# Patient Record
Sex: Female | Born: 1980 | Race: Black or African American | Hispanic: No | Smoking: Former smoker
Health system: Southern US, Community
[De-identification: ages and names within clinical notes are randomized; demographics above are authoritative.]

## PROBLEM LIST (undated history)

## (undated) DIAGNOSIS — D649 Anemia, unspecified: Secondary | ICD-10-CM

## (undated) DIAGNOSIS — F32A Depression, unspecified: Secondary | ICD-10-CM

## (undated) HISTORY — DX: Anemia, unspecified: D64.9

## (undated) HISTORY — DX: Depression, unspecified: F32.A

## (undated) HISTORY — PX: DILATION AND CURETTAGE OF UTERUS: SHX78

---

## 2004-06-14 ENCOUNTER — Emergency Department (HOSPITAL_COMMUNITY): Admission: EM | Admit: 2004-06-14 | Discharge: 2004-06-14 | Payer: Self-pay | Admitting: Family Medicine

## 2005-11-12 ENCOUNTER — Inpatient Hospital Stay (HOSPITAL_COMMUNITY): Admission: AD | Admit: 2005-11-12 | Discharge: 2005-11-12 | Payer: Self-pay | Admitting: Family Medicine

## 2005-11-12 ENCOUNTER — Encounter: Payer: Self-pay | Admitting: Emergency Medicine

## 2005-11-15 ENCOUNTER — Encounter (INDEPENDENT_AMBULATORY_CARE_PROVIDER_SITE_OTHER): Payer: Self-pay | Admitting: *Deleted

## 2005-11-15 ENCOUNTER — Ambulatory Visit (HOSPITAL_COMMUNITY): Admission: AD | Admit: 2005-11-15 | Discharge: 2005-11-15 | Payer: Self-pay | Admitting: Obstetrics & Gynecology

## 2006-07-19 ENCOUNTER — Emergency Department (HOSPITAL_COMMUNITY): Admission: EM | Admit: 2006-07-19 | Discharge: 2006-07-19 | Payer: Self-pay | Admitting: Family Medicine

## 2006-11-01 ENCOUNTER — Emergency Department (HOSPITAL_COMMUNITY): Admission: EM | Admit: 2006-11-01 | Discharge: 2006-11-01 | Payer: Self-pay | Admitting: Emergency Medicine

## 2007-10-21 IMAGING — US US PELVIS COMPLETE MODIFY
1 series · 14 of 25 positions shown · non-contrast
Comparison: none

CLINICAL DATA: Right lower quadrant pain.  
TRANSABDOMINAL AND TRANSVAGINAL PELVIC ULTRASOUND:
TECHNIQUE: Both transabdominal and transvaginal ultrasound examinations of the pelvis were performed including evaluation of the uterus, ovaries, adnexal regions, and pelvic cul-de-sac.

[Series 1: unknown · 0.28mm/px · 14 of 51 slices shown]
[im 1/51]
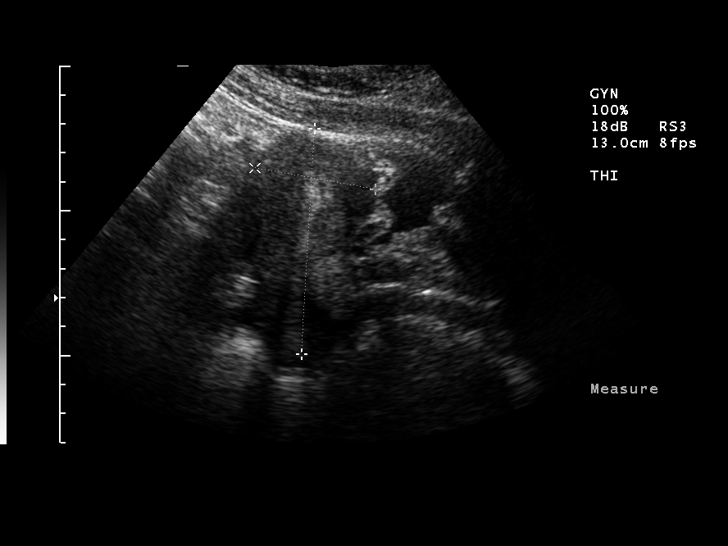
[im 5/51]
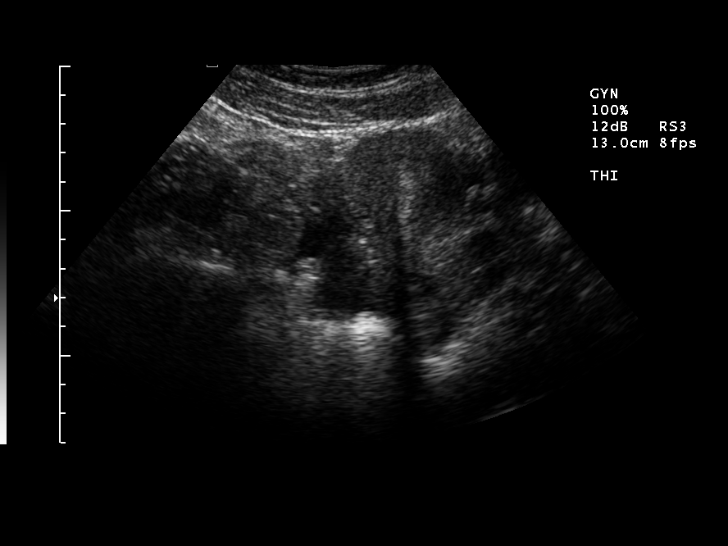
[im 9/51]
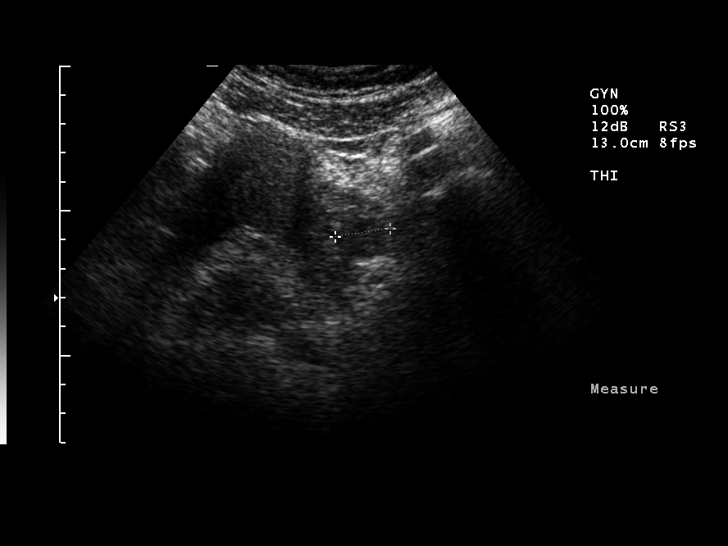
[im 13/51]
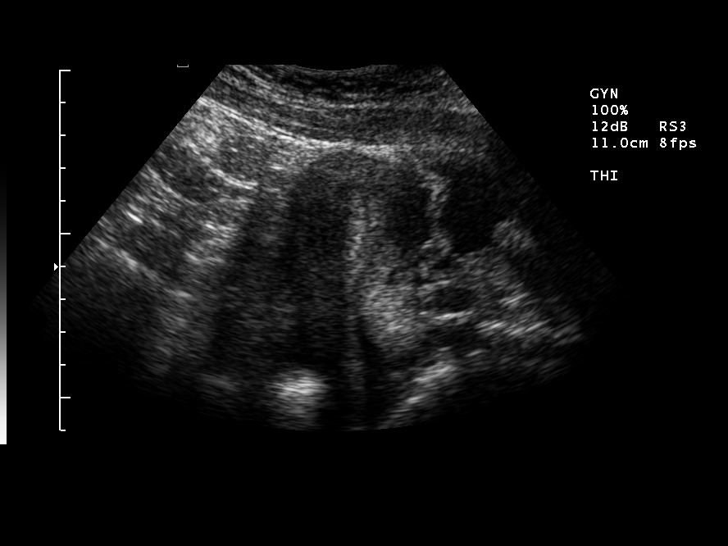
[im 17/51]
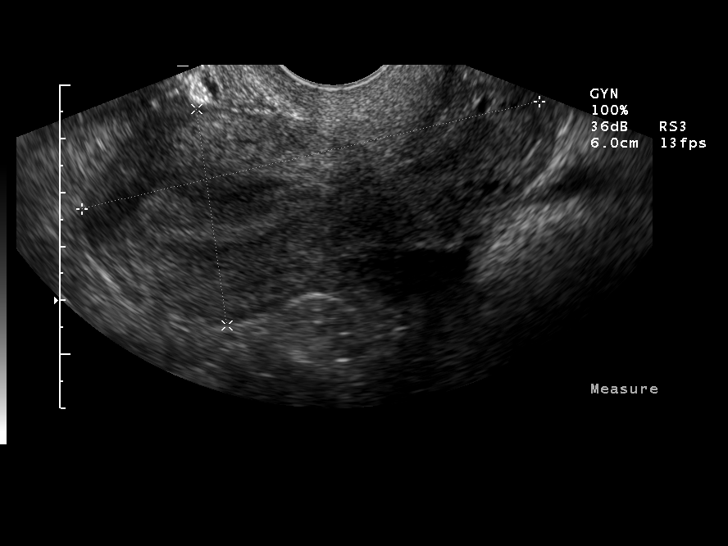
[im 19/51]
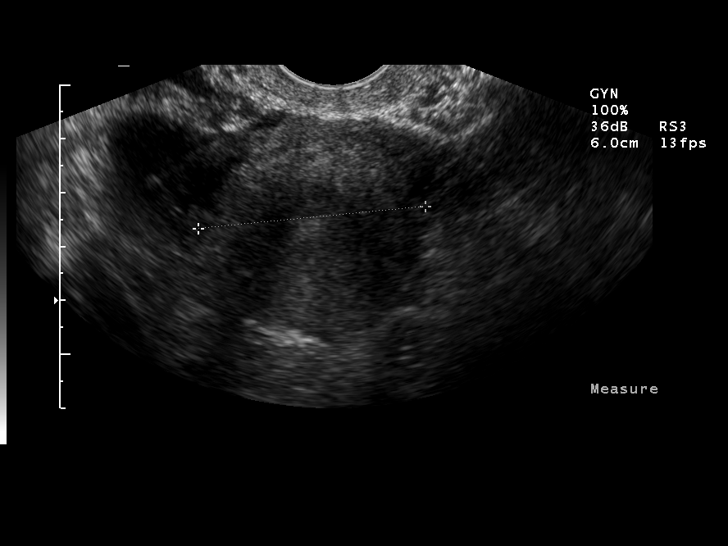
[im 23/51]
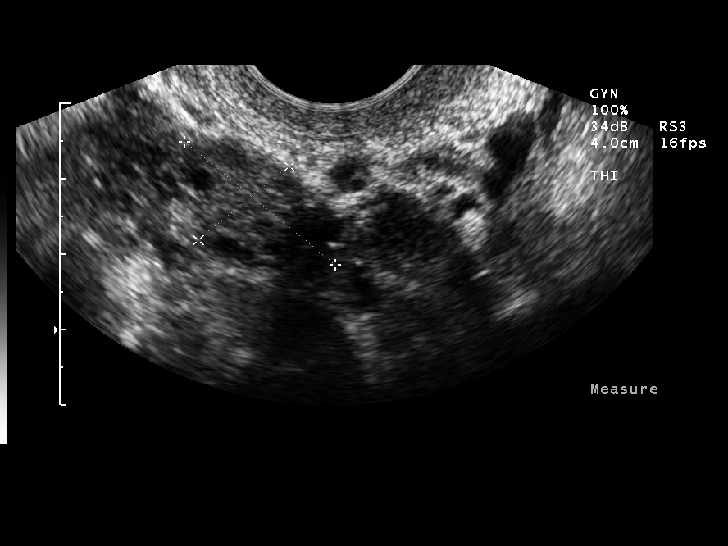
[im 28/51]
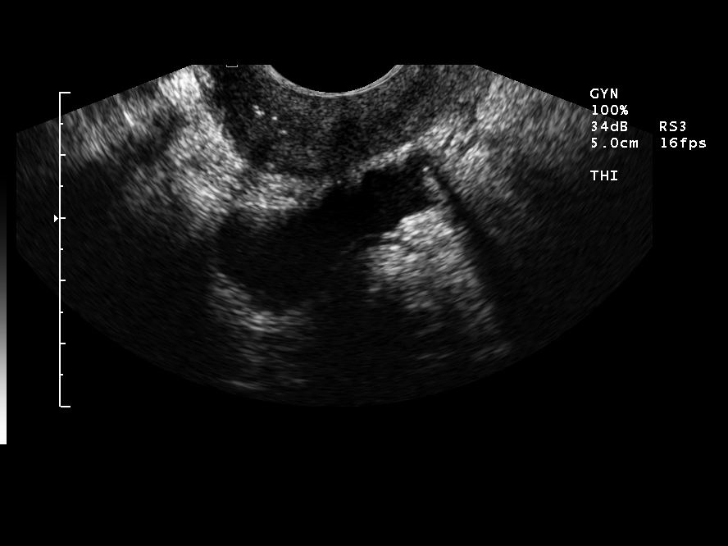
[im 32/51]
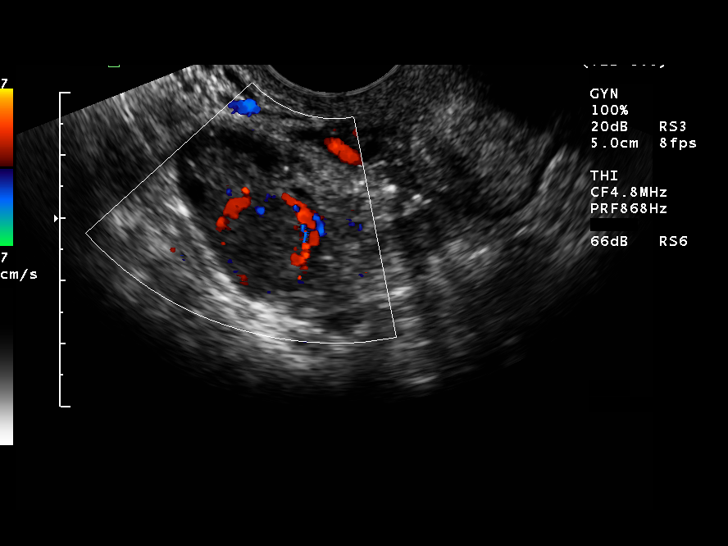
[im 34/51]
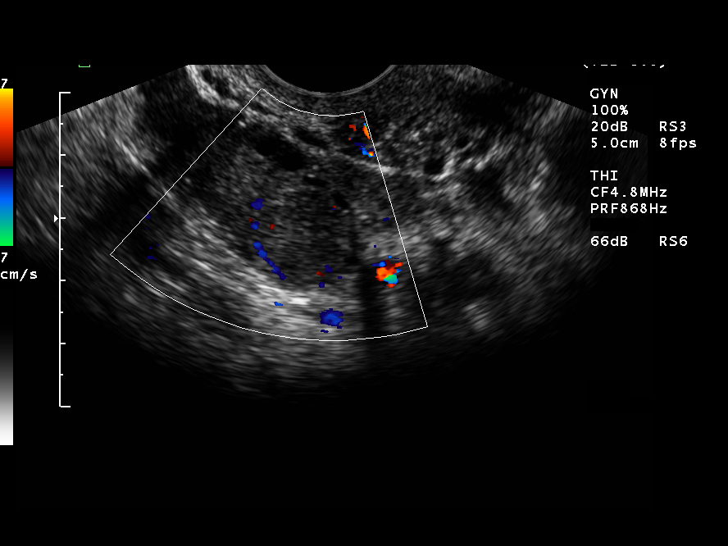
[im 38/51]
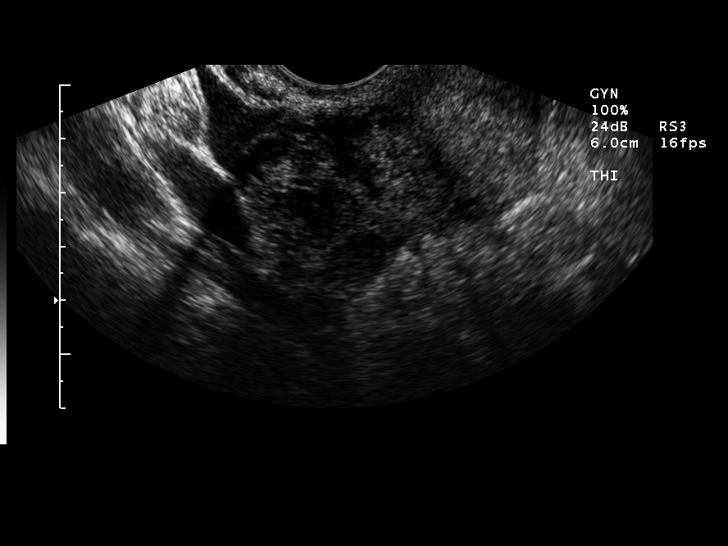
[im 42/51]
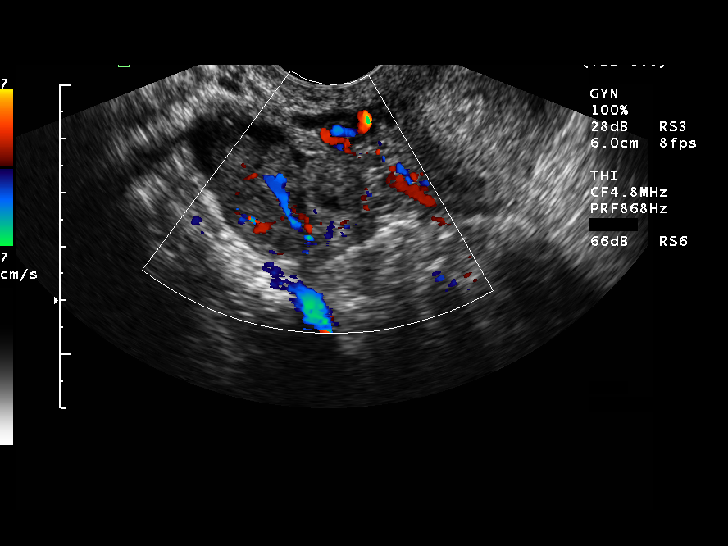
[im 46/51]
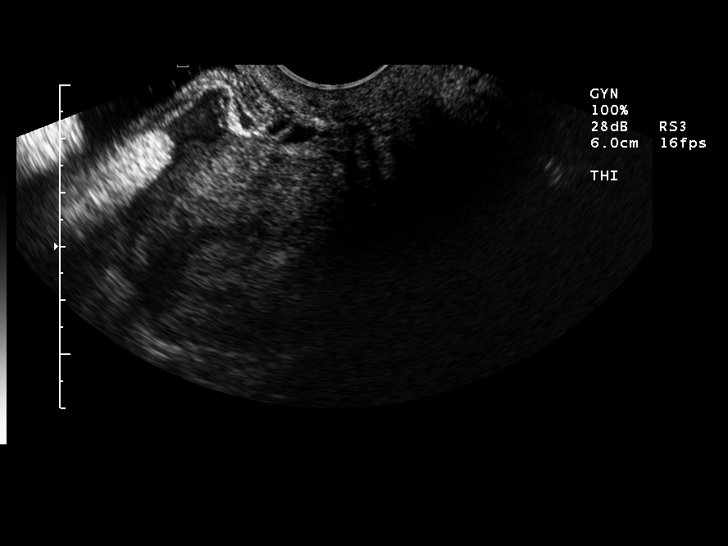
[im 51/51]
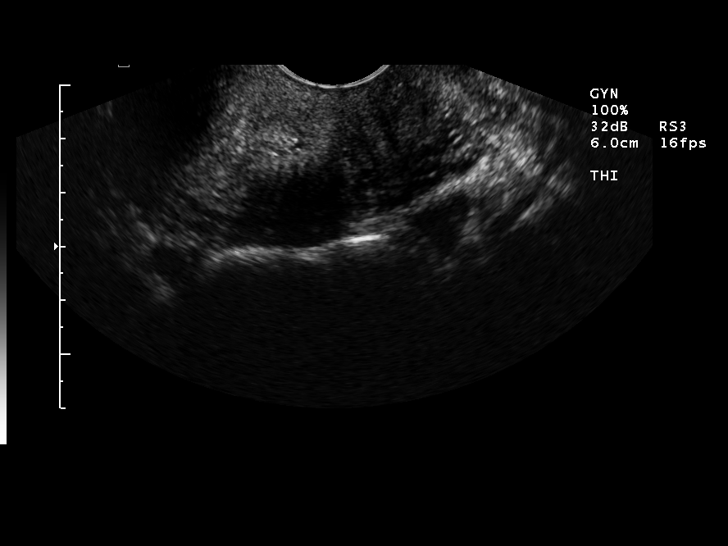

[14 of 25 positions shown; findings below may reference images not displayed]

FINDINGS: The uterus measures 8.7 cm in length and fundus measures 4.1 cm x 4.2 cm in transverse dimensions.  Endometrial stripe complex measures approximately 1 cm in thickness.  Right ovary measures 2.7 x 3.1 x 4.5 cm and contains a 1.6 x 1.6 x 1.8 cm hypoechoic focus, the etiology of which is uncertain.  This may represent a complex cyst or lesion such as ovarian fibroma.  The possibility of right ectopic pregnancy cannot be excluded with certainty.  Left ovary measures 1.6 x 2.2 x 2.6 cm.  Moderate amount of free pelvic fluid.  No evidence of intrauterine or extrauterine gestational sac.
IMPRESSION: Slightly prominent-sized right ovary which contains a hypoechoic focus, the etiology of which is uncertain.  Consider follow-up ultrasound after a couple of menstrual cycles to re-evaluate the right ovary.  Moderate free fluid.  Ectopic pregnancy cannot be excluded.

## 2008-08-04 ENCOUNTER — Emergency Department (HOSPITAL_COMMUNITY): Admission: EM | Admit: 2008-08-04 | Discharge: 2008-08-04 | Payer: Self-pay | Admitting: Emergency Medicine

## 2010-07-08 LAB — POCT PREGNANCY, URINE: Preg Test, Ur: NEGATIVE

## 2010-08-15 NOTE — Op Note (Signed)
NAME:  Debra Walsh, Debra Walsh               ACCOUNT NO.:  [id number]   MEDICAL RECORD NO.:  [id number]         PATIENT TYPE:  AMB   LOCATION:                                FACILITY:  [facility]   PHYSICIAN:  Gretta Cool, M.D.      DATE OF BIRTH:   DATE OF PROCEDURE:  DATE OF DISCHARGE:                                 OPERATIVE REPORT   PREOPERATIVE DIAGNOSIS:  Right ectopic pregnancy, ruptured, with  hemoperitoneum.   POSTOPERATIVE DIAGNOSIS:  Right ectopic pregnancy, ruptured, with  hemoperitoneum.   PROCEDURE:  Diagnostic laparoscopy, right salpingo-oophorectomy, lysis of  extensive ovarian and tubal adhesions.   SURGEON:  Gretta Cool, M.D.   ANESTHESIA:  General orotracheal.   INDICATIONS FOR PROCEDURE:  30 year old G2, P1, last menstrual period October 10, 2005, no aberration of menstrual periods prior to that.  She has a  history of gonorrhea and chlamydia after her first child at age 82.  She has  been infertile since that time.  She presented to the emergency room three  days earlier to Dr. Leda Quail and had a 300+ HCG titer and some fluid  and a mass in the right lower quadrant compatible with clot.  She had no  uterine pregnancy identified.  She was elected to follow up with ectopic  precaution.  The patient returned today after taking ibuprofen this  afternoon with sudden onset of severe pain.  Her pain was increased with  moving, walking, activity.  She had repeat ultrasound that showed larger  hemoperitoneum and an enlarged mass in the right lower quadrant.  She is now  admitted for definitive therapy by diagnostic laparoscopy, possible  salpingectomy, possible laparotomy.   DESCRIPTION OF PROCEDURE:  Under excellent general anesthesia, orotracheal,  with the patient prepped and draped in Allen stirrups, a subumbilical  incision was made and Veress cannula introduced.  After adequate  pneumoperitoneum, the laparoscope and trocar was introduced and pelvic  organs  visualized.  There was approximately 50 to 100 cc of blood in the  peritoneal cavity.  There was a large clot around the right fallopian tube.  The fallopian tube was bound down into the cul-de-sac and draped over the  ovary with extensive adhesions there.  As the adhesions were lysed, the  ovary and tube were progressively mobilized until the clot could be  evacuated.  With suction irrigation, the clot and blood and debris was  removed.  There was no clear rupture site.  There was some bleeding from  adhesions.  Because of the extensive nature of adhesions, decision was made  to proceed with a right salpingo-oophorectomy.  The fallopian tube was  mobilized sufficiently to allow cautery with the Gyrus tripolar instrument.  The tube was progressively resected.  At this point, the pedicles were all  dry.  The fallopian tube was removed through the 10/11-mm port site without  difficulty.  The pelvis was then irrigated once again to remove all  clot and debris.  At this point, the pedicles remained dry.  The incisions  were closed with deep suture of 0  Vicryl and skin closure of 5-0 Vicryl and  Steri-Strips.  At the end of the procedure, sponge and lap counts were  correct, and there were no complications.           ______________________________  Gretta Cool, M.D.     CWL/MEDQ  D:  11/15/2005  T:  11/15/2005  Job:  [id number]   cc:   M. Leda Quail, MD

## 2010-12-11 ENCOUNTER — Inpatient Hospital Stay (HOSPITAL_COMMUNITY)
Admission: RE | Admit: 2010-12-11 | Discharge: 2010-12-11 | Disposition: A | Discharge status: Home / Self Care | Payer: Self-pay | Source: Ambulatory Visit | Attending: Family Medicine | Admitting: Family Medicine

## 2011-12-01 ENCOUNTER — Encounter (HOSPITAL_COMMUNITY): Payer: Self-pay | Admitting: Emergency Medicine

## 2011-12-01 ENCOUNTER — Emergency Department (HOSPITAL_COMMUNITY): Payer: Self-pay

## 2011-12-01 ENCOUNTER — Emergency Department (HOSPITAL_COMMUNITY)
Admission: EM | Admit: 2011-12-01 | Discharge: 2011-12-01 | Disposition: A | Discharge status: Home / Self Care | Payer: Self-pay | Attending: Emergency Medicine | Admitting: Emergency Medicine

## 2011-12-01 DIAGNOSIS — M25561 Pain in right knee: Secondary | ICD-10-CM

## 2011-12-01 DIAGNOSIS — M25569 Pain in unspecified knee: Secondary | ICD-10-CM | POA: Insufficient documentation

## 2011-12-01 MED ORDER — HYDROCODONE-ACETAMINOPHEN 5-325 MG PO TABS: ORAL_TABLET | ORAL | Status: AC

## 2011-12-01 MED ORDER — NAPROXEN 250 MG PO TABS: 250.0000 mg | ORAL_TABLET | Freq: Two times a day (BID) | ORAL | Status: AC

## 2011-12-01 MED ORDER — IBUPROFEN 400 MG PO TABS
400.0000 mg | ORAL_TABLET | Freq: Once | ORAL | Status: AC
  Administered 2011-12-01: 400 mg via ORAL
  Filled 2011-12-01: qty 1

## 2011-12-01 NOTE — ED Provider Notes (Signed)
History    This chart was scribed for Laray Anger, DO, MD by Smitty Pluck. The patient was seen in room TR09C and the patient's care was started at 1:30PM.   CSN: [id number]  Arrival date & time 12/01/11  1140   First MD Initiated Contact with Patient 12/01/11 1330      Chief Complaint  Patient presents with  . Knee Pain    Patient is a 31 y.o. female presenting with knee pain. The history is provided by the patient.  Knee Pain   Debra Walsh is a 31 y.o. female who presents to the Emergency Department complaining of gradual onset and persistence of constant right knee "pain" onset 1 day ago. Pt states she works as a Engineer, mining recall specific injury. Reports that bending and movement aggravates the pain. Denies any other pain. Denies tingling/numbness in extremity, no focal motor weakness, no rash, no fevers.    History reviewed. No pertinent past medical history.  History reviewed. No pertinent past surgical history.   History  Substance Use Topics  . Smoking status: Current Everyday Smoker  . Smokeless tobacco: Not on file  . Alcohol Use: Yes     occ    Review of Systems ROS: Statement: All systems negative except as marked or noted in the HPI; Constitutional: Negative for fever and chills. ; ; Eyes: Negative for eye pain, redness and discharge. ; ; ENMT: Negative for ear pain, hoarseness, nasal congestion, sinus pressure and sore throat. ; ; Cardiovascular: Negative for chest pain, palpitations, diaphoresis, dyspnea and peripheral edema. ; ; Respiratory: Negative for cough, wheezing and stridor. ; ; Gastrointestinal: Negative for nausea, vomiting, diarrhea, abdominal pain, blood in stool, hematemesis, jaundice and rectal bleeding. . ; ; Genitourinary: Negative for dysuria, flank pain and hematuria. ; ; Musculoskeletal: +knee pain. Negative for back pain and neck pain. Negative for swelling and trauma.; ; Skin: Negative for pruritus, rash,  abrasions, blisters, bruising and skin lesion.; ; Neuro: Negative for headache, lightheadedness and neck stiffness. Negative for weakness, altered level of consciousness , altered mental status, extremity weakness, paresthesias, involuntary movement, seizure and syncope.     Allergies  Review of patient's allergies indicates no known allergies.  Home Medications   Current Outpatient Rx  Name Route Sig Dispense Refill  . GOODY HEADACHE PO Oral Take 2 Packages by mouth once. For pain    . ICY HOT EX Apply externally Apply 1 application topically daily as needed. Knee pain      BP 122/95  Pulse 88  Temp 98 F (36.7 C)  Resp 16  SpO2 100%  LMP 11/30/2011  Physical Exam 1335: Physical examination:  Nursing notes reviewed; Vital signs and O2 SAT reviewed;  Constitutional: Well developed, Well nourished, Well hydrated, In no acute distress; Head:  Normocephalic, atraumatic; Eyes: EOMI, PERRL, No scleral icterus; ENMT: Mouth and pharynx normal, Mucous membranes moist; Neck: Supple, Full range of motion, No lymphadenopathy; Cardiovascular: Regular rate and rhythm, No murmur, rub, or gallop; Respiratory: Breath sounds clear & equal bilaterally, No rales, rhonchi, wheezes.  Speaking full sentences with ease, Normal respiratory effort/excursion; Chest: Nontender, Movement normal;; Extremities: Pulses normal, +decreased F/E right knee, due to increasing pain.  Pt is able to lift extended RLE off stretcher, and extend right lower leg against resistance.  No ligamentous laxity.  No patellar or quad tendon step-offs.  NMS intact right foot, strong pedal pp.  No proximal fibular head tenderness.  No edema,  erythema, ecchymosis, warmth or deformity.  +TTP patella area, no other specific area of point tenderness. +plantarflexion of right foot with calf squeeze.  NT right ankle/foot. No calf tenderness, edema or asymmetry.; Neuro: AA&Ox3, Major CN grossly intact.  Speech clear. No gross focal motor or sensory  deficits in extremities.; Skin: Color normal, Warm, Dry.   ED Course  Procedures    MDM  MDM Reviewed: nursing note and vitals Interpretation: x-ray   Dg Knee Complete 4 Views Right 12/01/2011  *RADIOLOGY REPORT*  Clinical Data: Right knee pain.  No injury.  RIGHT KNEE - COMPLETE 4+ VIEW  Comparison: None.  Findings: Anatomic alignment of the right knee.  No effusion.  No fracture.  IMPRESSION: Negative.   Original Report Authenticated By: Andreas Newport, M.D.       1400:  No acute findings on XR.  Will tx symptomatically.  Dx testing d/w pt.  Questions answered.  Verb understanding, agreeable to d/c home with outpt f/u.      I personally performed the services described in this documentation, which was scribed in my presence. The recorded information has been reviewed and considered. Natalea Sutliff Allison Quarry, DO 12/03/11 [phone]

## 2011-12-01 NOTE — ED Notes (Signed)
Pt c/o right knee pain x 3 days; pt denies obvious injury 

## 2011-12-01 NOTE — Progress Notes (Signed)
Orthopedic Tech Progress Note Patient Details:  Debra Walsh [birth year] [id number]  Ortho Devices Type of Ortho Device: Crutches;Knee Immobilizer Ortho Device/Splint Location: (R) LE Ortho Device/Splint Interventions: Application   [name] 12/01/2011, 1:57 PM

## 2013-07-24 ENCOUNTER — Encounter (HOSPITAL_COMMUNITY): Payer: Self-pay | Admitting: Emergency Medicine

## 2013-07-24 ENCOUNTER — Emergency Department (HOSPITAL_COMMUNITY)
Admission: EM | Admit: 2013-07-24 | Discharge: 2013-07-24 | Disposition: A | Discharge status: Home / Self Care | Payer: Self-pay | Attending: Emergency Medicine | Admitting: Emergency Medicine

## 2013-07-24 ENCOUNTER — Emergency Department (HOSPITAL_COMMUNITY): Payer: Self-pay

## 2013-07-24 DIAGNOSIS — J069 Acute upper respiratory infection, unspecified: Secondary | ICD-10-CM | POA: Insufficient documentation

## 2013-07-24 DIAGNOSIS — K029 Dental caries, unspecified: Secondary | ICD-10-CM | POA: Insufficient documentation

## 2013-07-24 DIAGNOSIS — F17200 Nicotine dependence, unspecified, uncomplicated: Secondary | ICD-10-CM | POA: Insufficient documentation

## 2013-07-24 DIAGNOSIS — R6889 Other general symptoms and signs: Secondary | ICD-10-CM | POA: Insufficient documentation

## 2013-07-24 DIAGNOSIS — J3489 Other specified disorders of nose and nasal sinuses: Secondary | ICD-10-CM | POA: Insufficient documentation

## 2013-07-24 DIAGNOSIS — R6883 Chills (without fever): Secondary | ICD-10-CM | POA: Insufficient documentation

## 2013-07-24 DIAGNOSIS — K089 Disorder of teeth and supporting structures, unspecified: Secondary | ICD-10-CM | POA: Insufficient documentation

## 2013-07-24 DIAGNOSIS — K0889 Other specified disorders of teeth and supporting structures: Secondary | ICD-10-CM

## 2013-07-24 MED ORDER — BENZONATATE 100 MG PO CAPS: 100.0000 mg | ORAL_CAPSULE | Freq: Three times a day (TID) | ORAL | Status: DC | PRN

## 2013-07-24 MED ORDER — NAPROXEN 250 MG PO TABS: 250.0000 mg | ORAL_TABLET | Freq: Two times a day (BID) | ORAL | Status: DC

## 2013-07-24 MED ORDER — PENICILLIN V POTASSIUM 250 MG PO TABS: 250.0000 mg | ORAL_TABLET | Freq: Four times a day (QID) | ORAL | Status: DC

## 2013-07-24 NOTE — ED Notes (Signed)
Pt is here with tightness in chest, cough productive with brown sputum, hard to swallow and complains of right upper tooth pain and hurts to swallow.  Pt thinks she may have had fever.  Pt changed into gown.  Pt sounds congested

## 2013-07-24 NOTE — ED Provider Notes (Signed)
CSN: [id number]     Arrival date & time 07/24/13  0907 History   First MD Initiated Contact with Patient 07/24/13 [phone]     Chief Complaint  Patient presents with  . Cough  . Nasal Congestion  . Dental Pain      HPI Pt was seen at 0930. Per pt, c/o gradual onset and persistence of constant right upper tooth "pain" for the past 1 week.  Denies intra-oral edema, no rash, no facial swelling, no dysphagia, no neck pain.  The condition is aggravated by nothing. The condition is relieved by nothing. The patient has no significant history of serious medical conditions.  Per pt, c/o gradual onset and persistence of constant sore throat, runny/stuffy nose, sinus congestion, chills, and cough for the past 4 days.  Denies objective fevers, no rash, no CP/SOB, no N/V/D, no abd pain.    History reviewed. No pertinent past medical history.  History reviewed. No pertinent past surgical history.  History  Substance Use Topics  . Smoking status: Current Every Day Smoker  . Smokeless tobacco: Not on file  . Alcohol Use: Yes     Comment: occ    Review of Systems ROS: Statement: All systems negative except as marked or noted in the HPI; Constitutional: Negative for fever and +chills. ; ; Eyes: Negative for eye pain and discharge. ; ; ENMT: Positive for dental caries, dental hygiene poor and toothache. +rhinorrhea, sinus congestion, nasal congestion. Negative for ear pain, bleeding gums, dental injury, facial deformity, facial swelling, hoarseness, throat swelling and tongue swollen. ; ; Cardiovascular: Negative for chest pain, palpitations, diaphoresis, dyspnea and peripheral edema. ; ; Respiratory: +cough. Negative for wheezing and stridor. ; ; Gastrointestinal: Negative for nausea, vomiting, diarrhea and abdominal pain. ; ; Genitourinary: Negative for dysuria, flank pain and hematuria. ; ; Musculoskeletal: Negative for back pain and neck pain. ; ; Skin: Negative for rash and skin lesion. ; ; Neuro: Negative  for headache, lightheadedness and neck stiffness.      Allergies  Review of patient's allergies indicates no known allergies.  Home Medications   Prior to Admission medications   Medication Sig Start Date End Date Taking? Authorizing Provider  Aspirin-Acetaminophen-Caffeine (GOODY HEADACHE PO) Take 2 Packages by mouth once. For pain    Historical Provider, MD  Menthol, Topical Analgesic, (ICY HOT EX) Apply 1 application topically daily as needed. Knee pain    Historical Provider, MD   BP 124/87  Pulse 94  Temp(Src) 98.3 F (36.8 C) (Oral)  Resp 18  Ht 5\' 3"  (1.6 m)  Wt 170 lb (77.111 kg)  BMI 30.12 kg/m2  SpO2 100%  LMP 06/29/2013 Physical Exam 0935: Physical examination: Vital signs and O2 SAT: Reviewed; Constitutional: Well developed, Well nourished, Well hydrated, In no acute distress; Head and Face: Normocephalic, Atraumatic; Eyes: EOMI, PERRL, No scleral icterus; ENMT: Mouth and pharynx normal, Poor dentition, Widespread dental decay, Left TM normal, Right TM normal, Mucous membranes moist, +upper right 2nd molar with dental decay.  No gingival erythema, edema, fluctuance, or drainage.  No intra-oral edema. No submandibular or sublingual edema. No hoarse voice, no drooling, no stridor. No trismus. +edemetous nasal turbinates bilat with clear rhinorrhea. Mouth and pharynx without lesions. No tonsillar exudates. No pain with manipulation of larynx. ; Neck: Supple, Full range of motion, No lymphadenopathy; Cardiovascular: Regular rate and rhythm, No murmur, rub, or gallop; Respiratory: Breath sounds clear & equal bilaterally, No rales, rhonchi, wheezes, Normal respiratory effort/excursion; Chest: Nontender, Movement normal; Extremities: Pulses  normal, No tenderness, No edema; Neuro: AA&Ox3, Major CN grossly intact.  No gross focal motor or sensory deficits in extremities. Climbs on and off stretcher easily by herself. Gait steady.; Skin: Color normal, No rash, No petechiae, Warm,  Dry   ED Course  Procedures     EKG Interpretation None      MDM  MDM Reviewed: previous chart, nursing note and vitals Interpretation: x-ray    Dg Chest 2 View 07/24/2013   CLINICAL DATA:  Cough and congestion  EXAM: CHEST  2 VIEW  COMPARISON:  None.  FINDINGS: The heart size and mediastinal contours are within normal limits. Both lungs are clear. The visualized skeletal structures are unremarkable.  IMPRESSION: No active cardiopulmonary disease.   Electronically Signed   By: Daryll Brod M.D.   On: 07/24/2013 10:11    1020:  CXR without infiltrate. Will tx symptomatically at this time. Pt encouraged to f/u with dentist or oral surgeon for her dental needs for good continuity of care and definitive treatment.  Verb understanding.    Alfonzo Feller, DO 07/26/13 1624

## 2013-07-24 NOTE — Discharge Instructions (Signed)
°Emergency Department Resource Guide °1) Find a Doctor and Pay Out of Pocket °Although you won't have to find out who is covered by your insurance plan, it is a good idea to ask around and get recommendations. You will then need to call the office and see if the doctor you have chosen will accept you as a new patient and what types of options they offer for patients who are self-pay. Some doctors offer discounts or will set up payment plans for their patients who do not have insurance, but you will need to ask so you aren't surprised when you get to your appointment. ° °2) Contact Your Local Health Department °Not all health departments have doctors that can see patients for sick visits, but many do, so it is worth a call to see if yours does. If you don't know where your local health department is, you can check in your phone book. The CDC also has a tool to help you locate your state's health department, and many state websites also have listings of all of their local health departments. ° °3) Find a Walk-in Clinic °If your illness is not likely to be very severe or complicated, you may want to try a walk in clinic. These are popping up all over the country in pharmacies, drugstores, and shopping centers. They're usually staffed by nurse practitioners or physician assistants that have been trained to treat common illnesses and complaints. They're usually fairly quick and inexpensive. However, if you have serious medical issues or chronic medical problems, these are probably not your best option. ° °No Primary Care Doctor: °- Call Health Connect at  832-8000 - they can help you locate a primary care doctor that  accepts your insurance, provides certain services, etc. °- Physician Referral Service- 1-800-533-3463 ° °Chronic Pain Problems: °Organization         Address  Phone   Notes  °[facility]  (336) 297-2271 Patients need to be referred by their primary care doctor.  ° °Medication  Assistance: °Organization         Address  Phone   Notes  °[place] [facility] [address] °[place], [place] [address] (336) 641-8030 --Must be a resident of [place] °-- Must have NO insurance coverage whatsoever (no Medicaid/ Medicare, etc.) °-- The pt. MUST have a primary care doctor that directs their care regularly and follows them in the community °  °[facility]  (866) 331-1348   °[employer]  (888) 892-1162   ° °Agencies that provide inexpensive medical care: °Organization         Address  Phone   Notes  °[facility]  (336) 832-8035   °[facility]    (336) 832-7272   °[facility] [address] °[place], [place] [address] (336) 832-4777   °[facility] [address], °[place] (336) 271-4999   °[facility]    (336) 373-0678   °[facility]    (336) 272-1050   °[facility] ° [address], [place] Phone:  (336) 832-4444, Fax:  (336) 832-4440 Hours of Operation:  9 am - 6 pm, M-F.  Also accepts Medicaid/Medicare and self-pay.  °[facility] ° [address], [place] Phone: (336) 832-3150, Fax: (336) [phone]. Hours of Operation:  8:30 am - 5:30 pm, M-F.  Also accepts Medicaid and self-pay.  °[facility] [place] [address], [place] Phone: (336) 878-6027   °[facility] [address], [place], [place] (336)723-1848, Ext. 123 Mondays & Thursdays: 7-9 AM.  First 15 patients are seen on a first come, first serve basis. °  ° °Medicaid-accepting [place] Providers: ° °Organization         Address  Phone   Notes  °[facility] [address], [place] (336) 641-2100 Also accepts self-pay patients.  °[facility] [address], [place] ° (336) 856-9996   °[facility] [address], [place]  (336) 288-8857   °[facility] [address] [place] [address], [place] (336) 299-7000   °Veita Bland [address], [place]  ° (336) 373-1557 Only accepts [place] Access Medicaid patients after they have their name applied to their card.  ° °Self-Pay (no insurance) in [place]: ° °Organization         Address  Phone   Notes  °Sickle Cell Patients, [facility] [address], [place] (336) 832-1970   °[facility] [address], [place] (336) 832-4400   °[facility] ° [address], [place] (336) 992-4800   °[facility]/Dr. Osei-Bonsu ° [address] [place] [address], [place] or [address], [place] (336) 841-8500 Phone number for both [place] and [place] locations is the same.  °[facility] [address], [place] (336) 299-0000   °[facility] [address] [place] [address], [place] or [address] (336) 852-7530 °(336) 878-2260   °[facility] [address], [place] (336) 350-1642, phone; (336) 294-5005, fax Sees patients 1st and 3rd Saturday of every month.  Must not qualify for public or private insurance (i.e. Medicaid, Medicare, [place] Health Choice, Veterans' Benefits) • Household income should be no more than 200% of the poverty level •The clinic cannot treat you if you are pregnant or think you are pregnant • Sexually transmitted diseases are not treated at the clinic.  ° ° °Dental Care: °Organization         Address  Phone  Notes  °[place] [facility] [address], [place] (336) 641-6152 Accepts children up to age 21 who are enrolled in Medicaid or [place] Health Choice; pregnant women with a Medicaid card; and children who have applied for Medicaid or [place] Health Choice, but were declined, whose parents can pay a reduced fee at time of service.  °[place]  [facility] [place]  [address], [place] (336) 641-7733 Accepts children up to age 21 who are enrolled in Medicaid or [place] Health Choice; pregnant women with a Medicaid card; and children who have applied for Medicaid or  Health Choice, but were declined, whose parents can pay a reduced fee at time of service.  °[facility] ° [address],  (336) 641-4533 Patients are seen by appointment only. Walk-ins are not accepted. [facility] will see patients 18 years of age and older. °Monday - Tuesday (8am-5pm) °Most Wednesdays (8:30-5pm) °$30 per visit, cash only  °[facility] ° [address], [place] (336) 641-4533 Patients are seen by appointment only. Walk-ins are not accepted. [facility] will see patients 18 years of age and older. °One   Wednesday Evening (Monthly: Volunteer Based).  $30 per visit, cash only  °[facility]  (919) [phone] for adults; Children under age 4, call Graduate Pediatric Dentistry at ([phone]) [phone]. Children aged 4-14, please call ([phone]) [phone] to request a pediatric application. ° Dental services are provided in all areas of dental care including fillings, crowns and bridges, complete and partial dentures, implants, gum treatment, root canals, and extractions. Preventive care is also provided. Treatment is provided to both adults and children. °Patients are selected via a lottery and there is often a waiting list. °  °[facility] [address], °[place] ° (336) 763-8833 [email or web address] °  °[facility] [address], [place], [place] (336)723-1848, Ext. 123 Second and Fourth Thursday of each month, opens at 6:30 AM; Clinic ends at 9 AM.  Patients are seen on a first-come first-served basis, and a limited number are seen during each clinic.  ° °[facility] ° [address], [place], [place] (336) 723-7904    Eligibility Requirements °You must have lived in [place], [place], or [place] counties for at least the last three months. °  You cannot be eligible for state or federal sponsored healthcare insurance, including Veterans Administration, Medicaid, or Medicare. °  You generally cannot be eligible for healthcare insurance through your employer.  °  How to apply: °Eligibility screenings are held every Tuesday and Wednesday afternoon from 1:00 pm until 4:00 pm. You do not need an appointment for the interview!  °[facility] [address], [place], [place] 336-631-2330   °[facility]  336-342-8273   °[facility]  336-703-3100   °[facility]  336-570-6415   ° °Behavioral Health Resources in the Community: °Intensive Outpatient Programs °Organization         Address  Phone  Notes  °[facility] [address], [place], [place] 336-878-6098   °[facility] [address], [place], [place] 336-832-9800   °[facility] [address], [place], [place] ° 336-882-2125   °[facility] [address],  °[place], [place] 1-800-853-5163 or 336-641-4981   °Substance Abuse Resources °Organization         Address  Phone  Notes  °[facility]  336-882-2125   °[facility]  336-784-9470   °[facility]  336-285-9073   °[facility]  336-845-3988   °Residential & Outpatient Substance Abuse Program  1-800-659-3381   °Psychological Services °Organization         Address  Phone  Notes  ° Health  336- 832-9600   °[facility]  336- 378-7881   °[facility] [address], [place] 1-800-853-5163 or 336-641-4981   ° °Mobile Crisis Teams °Organization         Address  Phone  Notes  °[facility], Mobile Crisis Care Unit  1-877-626-1772   °Assertive °[facility] ° [address]  [place], [place] 336-834-9664   °Sharon DeEsch [address] °[place] 336-554-5454   ° °Self-Help/Support Groups °Organization         Address  Phone             Notes  °[facility] [place] - variety of support groups  336- 373-1402 Call for more information  °Narcotics Anonymous (NA), [facility] [address], °[place]   2 meetings at this location  ° °  Residential Treatment Programs Organization         Address  Phone  Notes  [facility] [address]    [place]  1-(917)645-7451   [facility]  [address] [place] [address], [place], [place]   [facility] [place], [place] (605)322-1976 Admissions: 8am-3pm M-F  [facility] [place] [address],    [place], [place] 409-735-3299   [facility] [address] [place], [place], [place]   [facility] [address]  [place], [place]   [facility] - Intensive Outpatient [address], [place], [place]   [facility] ([place].,  [place], [place] 1-(778)333-4344 or 417-713-7301   [facility] [address], [place], [place] Accepts Medicaid  [facility] [address],  [place] 1-252-172-2844 Substance Abuse/Addiction Treatment   [facility]         Address  Phone  Notes  [facility]  9085941506   Domenic Schwab, PhD [address] [place], [place]   2345101319 or (380)[phone]   [place], [place] 646-260-0177   [facility] [address], [place], [place] 339-868-3277 Insurance/Medicaid/sponsorship through [facility] and Families [address] [place] [address]                                    [place], [place] [phone] [place] [address], [place] [phone]    Dr. Adele Schilder  8036451702   [facility] Dept. 1) [address] [place] 2) [place] 3)  [address], [place] [phone]  [phone]   [place] [phone] or [phone] (After Hours)       Take over the counter decongestant (such as sudafed), as well as antihistamine (such as claritin or zyrtec), as directed on packaging, for the next week.  Use over the counter normal saline nasal spray, as instructed in the Emergency Department, several times per day for the next 2 weeks.  Call your regular medical doctor today to schedule a follow up appointment in the next 2 days. Call the dentist today to schedule a follow up appointment within the next week.  Return to the Emergency Department immediately if worsening.

## 2013-07-24 NOTE — ED Notes (Signed)
Pt presents with nasal/chest congestion, fever, and toothache x5 days. Pt reports taking OTC medication with no relief. No fever in triage

## 2015-01-04 ENCOUNTER — Emergency Department (HOSPITAL_BASED_OUTPATIENT_CLINIC_OR_DEPARTMENT_OTHER)
Admission: EM | Admit: 2015-01-04 | Discharge: 2015-01-04 | Disposition: A | Discharge status: Home / Self Care | Payer: Medicaid Other | Attending: Emergency Medicine | Admitting: Emergency Medicine

## 2015-01-04 ENCOUNTER — Encounter (HOSPITAL_BASED_OUTPATIENT_CLINIC_OR_DEPARTMENT_OTHER): Payer: Self-pay | Admitting: *Deleted

## 2015-01-04 DIAGNOSIS — K047 Periapical abscess without sinus: Secondary | ICD-10-CM

## 2015-01-04 DIAGNOSIS — Z720 Tobacco use: Secondary | ICD-10-CM | POA: Insufficient documentation

## 2015-01-04 DIAGNOSIS — Z791 Long term (current) use of non-steroidal anti-inflammatories (NSAID): Secondary | ICD-10-CM | POA: Diagnosis not present

## 2015-01-04 DIAGNOSIS — K0889 Other specified disorders of teeth and supporting structures: Secondary | ICD-10-CM | POA: Diagnosis present

## 2015-01-04 MED ORDER — HYDROCODONE-ACETAMINOPHEN 5-325 MG PO TABS: 1.0000 | ORAL_TABLET | Freq: Four times a day (QID) | ORAL | Status: DC | PRN

## 2015-01-04 MED ORDER — BUPIVACAINE-EPINEPHRINE (PF) 0.5% -1:200000 IJ SOLN
1.8000 mL | Freq: Once | INTRAMUSCULAR | Status: AC
  Administered 2015-01-04: 1.8 mL
  Filled 2015-01-04: qty 1.8

## 2015-01-04 MED ORDER — PENICILLIN V POTASSIUM 250 MG PO TABS: 250.0000 mg | ORAL_TABLET | Freq: Four times a day (QID) | ORAL | Status: AC

## 2015-01-04 NOTE — ED Provider Notes (Addendum)
CSN: [id number]     Arrival date & time 01/04/15  1921 History  By signing my name below, I, Debra Walsh, attest that this documentation has been prepared under the direction and in the presence of Blanchie Dessert, MD. Electronically Signed: Meriel Walsh, ED Scribe. 01/04/2015. 7:50 PM.   Chief Complaint  Patient presents with  . Dental Pain   The history is provided by the patient. No language interpreter was used.   HPI Comments: Cornisha Walsh is a 34 y.o. female, with no chronic medical conditions, who presents to the Emergency Department complaining of sudden onset, constant left-sided, lower dental pain with associated mild left-sided facial swelling onset this afternoon. Pt states she has an abscess in the affected area. She has taken ibuprofen 800 mg with no relief. No fevers or chills. NKDA  History reviewed. No pertinent past medical history. History reviewed. No pertinent past surgical history. No family history on file. Social History  Substance Use Topics  . Smoking status: Current Every Day Smoker -- 0.50 packs/day    Types: Cigarettes  . Smokeless tobacco: None  . Alcohol Use: Yes     Comment: occ   OB History    No data available     Review of Systems  Constitutional: Negative for fever and chills.  HENT: Positive for dental problem and facial swelling.    10 Systems reviewed and all are negative for acute change except as noted in the HPI.  Allergies  Review of patient's allergies indicates no known allergies.  Home Medications   Prior to Admission medications   Medication Sig Start Date End Date Taking? Authorizing Provider  acetaminophen (TYLENOL) 500 MG tablet Take 1,000 mg by mouth every 6 (six) hours as needed for mild pain.    Historical Provider, MD  benzonatate (TESSALON) 100 MG capsule Take 1 capsule (100 mg total) by mouth 3 (three) times daily as needed for cough. 07/24/13   Francine Graven, DO  naproxen (NAPROSYN) 250 MG tablet Take 1  tablet (250 mg total) by mouth 2 (two) times daily with a meal. 07/24/13   Francine Graven, DO  penicillin v potassium (VEETID) 250 MG tablet Take 1 tablet (250 mg total) by mouth 4 (four) times daily. 07/24/13   Francine Graven, DO  Phenyleph-Doxylamine-DM-APAP (ALKA-SELTZER PLS ALLERGY & CGH PO) Take 1 packet by mouth daily as needed (for cold/cough).    Historical Provider, MD   BP 129/83 mmHg  Pulse 87  Temp(Src) 98.2 F (36.8 C) (Oral)  Resp 18  Ht 5\' 3"  (1.6 m)  Wt 178 lb (80.74 kg)  BMI 31.54 kg/m2  SpO2 100%  LMP 12/22/2014 Physical Exam  Constitutional: She is oriented to person, place, and time. She appears well-developed and well-nourished. No distress.  HENT:  Head: Normocephalic.  Mouth/Throat:    Facial swelling of the left mandible.  Eyes: Conjunctivae are normal.  Neck: Normal range of motion. Neck supple.  Cardiovascular: Normal rate.   Pulmonary/Chest: Effort normal. No respiratory distress.  Musculoskeletal: Normal range of motion.  Neurological: She is alert and oriented to person, place, and time. Coordination normal.  Skin: Skin is warm.  Psychiatric: She has a normal mood and affect. Her behavior is normal.  Nursing note and vitals reviewed.   ED Course  Dental Date/Time: 01/04/2015 7:51 PM Performed by: Blanchie Dessert Authorized by: Blanchie Dessert Consent: Verbal consent obtained. Written consent not obtained. Risks and benefits: risks, benefits and alternatives were discussed Consent given by: patient Local anesthesia used: yes  Anesthesia: nerve block (apical nerve block) Local anesthetic: bupivacaine 0.5% with epinephrine Anesthetic total: 1.8 ml Patient sedated: no Patient tolerance: Patient tolerated the procedure well with no immediate complications    DIAGNOSTIC STUDIES: Oxygen Saturation is 100% on RA, normal by my interpretation.    COORDINATION OF CARE: 7:43 PM Discussed treatment plan with pt at bedside and pt agreed to plan.  Will perfrom dental block and prescribe antibiotic course. Will also give dental referral.    MDM   Final diagnoses:  Dental abscess    Pt with dental caries and facial swelling.  No signs of ludwig's angina or difficulty swallowing and no systemic symptoms. Will treat with PCN and have pt f/u with dentist.   I personally performed the services described in this documentation, which was scribed in my presence.  The recorded information has been reviewed and considered.    Blanchie Dessert, MD 01/04/15 1951  Blanchie Dessert, MD 01/04/15 5732

## 2015-01-04 NOTE — Discharge Instructions (Signed)
Dental Abscess A dental abscess is a collection of pus in or around a tooth. CAUSES This condition is caused by a bacterial infection around the root of the tooth that involves the inner part of the tooth (pulp). It may result from:  Severe tooth decay.  Trauma to the tooth that allows bacteria to enter into the pulp, such as a broken or chipped tooth.  Severe gum disease around a tooth. SYMPTOMS Symptoms of this condition include:  Severe pain in and around the infected tooth.  Swelling and redness around the infected tooth, in the mouth, or in the face.  Tenderness.  Pus drainage.  Bad breath.  Bitter taste in the mouth.  Difficulty swallowing.  Difficulty opening the mouth.  Nausea.  Vomiting.  Chills.  Swollen neck glands.  Fever. DIAGNOSIS This condition is diagnosed with examination of the infected tooth. During the exam, your dentist may tap on the infected tooth. Your dentist will also ask about your medical and dental history and may order X-rays. TREATMENT This condition is treated by eliminating the infection. This may be done with:  Antibiotic medicine.  A root canal. This may be performed to save the tooth.  Pulling (extracting) the tooth. This may also involve draining the abscess. This is done if the tooth cannot be saved. HOME CARE INSTRUCTIONS  Take medicines only as directed by your dentist.  If you were prescribed antibiotic medicine, finish all of it even if you start to feel better.  Rinse your mouth (gargle) often with salt water to relieve pain or swelling.  Do not drive or operate heavy machinery while taking pain medicine.  Do not apply heat to the outside of your mouth.  Keep all follow-up visits as directed by your dentist. This is important. SEEK MEDICAL CARE IF:  Your pain is worse and is not helped by medicine. SEEK IMMEDIATE MEDICAL CARE IF:  You have a fever or chills.  Your symptoms suddenly get worse.  You have a  very bad headache.  You have problems breathing or swallowing.  You have trouble opening your mouth.  You have swelling in your neck or around your eye.   This information is not intended to replace advice given to you by your health care provider. Make sure you discuss any questions you have with your health care provider.   Document Released: 03/16/2005 Document Revised: 07/31/2014 Document Reviewed: 03/13/2014 Elsevier Interactive Patient Education 2016 [employer].   Emergency Department Resource Guide 1) Find a Tax adviser and Pay Out of Pocket    Dental Care: Organization         Address  Phone  Notes  [facility] [address] [place], [place] [phone] Accepts children up to age 7 who are enrolled in [place] or [place]; pregnant women with a Medicaid card; and children who have applied for Medicaid or [place] Health Choice, but were declined, whose parents can pay a reduced fee at time of service.  [facility]  [address], [place] [phone] Accepts children up to age 42 who are enrolled in [place] or [place]; pregnant women with a Medicaid card; and children who have applied for Medicaid or [place] Health Choice, but were declined, whose parents can pay a reduced fee at time of service.  [facility]  [place] [phone] Patients are seen by appointment  only. Walk-ins are not accepted. [facility] will see patients 35 years of age and older. Monday - Tuesday (8am-5pm) Most Wednesdays (8:30-5pm) $30 per visit, cash only  [facility]  [address], [facility] [phone] Patients are seen by appointment only. Walk-ins are not accepted. [facility] will see patients 73 years of age and older. One Wednesday Evening (Monthly: Volunteer Based).  $30 per visit,  cash only  [place]  [phone] for adults; Children under age 61, call Graduate Pediatric Dentistry at [phone]. Children aged 42-14, please call [phone] to request a pediatric application.  Dental services are provided in all areas of dental care including fillings, crowns and bridges, complete and partial dentures, implants, gum treatment, root canals, and extractions. Preventive care is also provided. Treatment is provided to both adults and children. Patients are selected via a lottery and there is often a waiting list.   [facility] [address] [place]  ([phone] [email or web address]   [facility] [address] [place], [place] [phone], Ext. 123 Second and Fourth Thursday of each month, opens at 6:30 AM; Clinic ends at 9 AM.  Patients are seen on a first-come first-served basis, and a limited number are seen during each clinic.   [facility]  [address] [place] Danker [place], [place] [phone]   Eligibility Requirements You must have lived in [place], [place], or [place] counties for at least the last three months.   You cannot be eligible for state or federal sponsored [employer], including [employer], [place], or [employer].   You generally cannot be eligible for healthcare insurance through your employer.    How to apply: Eligibility screenings are held every Tuesday and Wednesday afternoon from 1:00 pm until 4:00 pm. You do not need an appointment for the interview!  [facility] [address], [place], [place]   [place]  [place]  [place]  [phone]

## 2015-01-04 NOTE — ED Notes (Signed)
Abscess tooth. Facial swelling since this afternoon.

## 2015-02-23 ENCOUNTER — Emergency Department (HOSPITAL_BASED_OUTPATIENT_CLINIC_OR_DEPARTMENT_OTHER)
Admission: EM | Admit: 2015-02-23 | Discharge: 2015-02-23 | Disposition: A | Discharge status: Home / Self Care | Payer: Medicaid Other | Attending: Emergency Medicine | Admitting: Emergency Medicine

## 2015-02-23 ENCOUNTER — Encounter (HOSPITAL_BASED_OUTPATIENT_CLINIC_OR_DEPARTMENT_OTHER): Payer: Self-pay | Admitting: Emergency Medicine

## 2015-02-23 ENCOUNTER — Emergency Department (HOSPITAL_BASED_OUTPATIENT_CLINIC_OR_DEPARTMENT_OTHER): Payer: Medicaid Other

## 2015-02-23 DIAGNOSIS — R197 Diarrhea, unspecified: Secondary | ICD-10-CM | POA: Diagnosis not present

## 2015-02-23 DIAGNOSIS — Z791 Long term (current) use of non-steroidal anti-inflammatories (NSAID): Secondary | ICD-10-CM | POA: Insufficient documentation

## 2015-02-23 DIAGNOSIS — J029 Acute pharyngitis, unspecified: Secondary | ICD-10-CM | POA: Insufficient documentation

## 2015-02-23 DIAGNOSIS — R6883 Chills (without fever): Secondary | ICD-10-CM | POA: Diagnosis not present

## 2015-02-23 DIAGNOSIS — R0602 Shortness of breath: Secondary | ICD-10-CM | POA: Insufficient documentation

## 2015-02-23 DIAGNOSIS — R0981 Nasal congestion: Secondary | ICD-10-CM | POA: Diagnosis not present

## 2015-02-23 DIAGNOSIS — R05 Cough: Secondary | ICD-10-CM

## 2015-02-23 DIAGNOSIS — F17210 Nicotine dependence, cigarettes, uncomplicated: Secondary | ICD-10-CM | POA: Insufficient documentation

## 2015-02-23 DIAGNOSIS — R059 Cough, unspecified: Secondary | ICD-10-CM

## 2015-02-23 MED ORDER — DEXAMETHASONE 4 MG PO TABS
ORAL_TABLET | ORAL | Status: AC
  Filled 2015-02-23: qty 3

## 2015-02-23 MED ORDER — DEXAMETHASONE 4 MG PO TABS
10.0000 mg | ORAL_TABLET | Freq: Once | ORAL | Status: AC
  Administered 2015-02-23: 10 mg via ORAL

## 2015-02-23 NOTE — Discharge Instructions (Signed)

## 2015-02-23 NOTE — ED Notes (Signed)
Pt reports cough cold and congestion x 3 days with no improvement, talking in triage with no problems, nad at this time

## 2015-02-23 NOTE — ED Provider Notes (Addendum)
CSN: [id number]     Arrival date & time 02/23/15  1510 History  By signing my name below, I, Helane Gunther, attest that this documentation has been prepared under the direction and in the presence of Debby Freiberg, MD. Electronically Signed: Helane Gunther, ED Scribe. 02/23/2015. 4:21 PM.    Chief Complaint  Patient presents with  . Cough   Patient is a 34 y.o. female presenting with cough. The history is provided by the patient. No language interpreter was used.  Cough Cough characteristics:  Non-productive Severity:  Moderate Onset quality:  Gradual Duration:  3 days Timing:  Constant Progression:  Unchanged Chronicity:  New Smoker: no   Context: sick contacts   Relieved by:  Nothing Worsened by:  Nothing tried Associated symptoms: fever (subjective) and sore throat   Associated symptoms: no chills and no rash    HPI Comments: Debra Walsh is a 34 y.o. female smoker at 0.5 ppd who presents to the Emergency Department complaining of cough onset 3 days ago. She reports associated congestion, sore throat, SOB, subjective fever, chills, and diarrhea (for 2 days after onset). She reports having had a sick contact. She reports having taken Theraflu and Nyquil. She denies any medical issues. Pt denies n/v, constipation, and abdominal pain.   History reviewed. No pertinent past medical history. History reviewed. No pertinent past surgical history. History reviewed. No pertinent family history. Social History  Substance Use Topics  . Smoking status: Current Every Day Smoker -- 0.50 packs/day    Types: Cigarettes  . Smokeless tobacco: None  . Alcohol Use: Yes     Comment: occ   OB History    No data available     Review of Systems  Constitutional: Positive for fever (subjective). Negative for chills.  HENT: Positive for sore throat.   Respiratory: Positive for cough.   Gastrointestinal: Positive for diarrhea. Negative for nausea, vomiting, abdominal pain and constipation.   Skin: Negative for rash.  All other systems reviewed and are negative.   Allergies  Review of patient's allergies indicates no known allergies.  Home Medications   Prior to Admission medications   Medication Sig Start Date End Date Taking? Authorizing Provider  acetaminophen (TYLENOL) 500 MG tablet Take 1,000 mg by mouth every 6 (six) hours as needed for mild pain.   Yes Historical Provider, MD  naproxen (NAPROSYN) 250 MG tablet Take 1 tablet (250 mg total) by mouth 2 (two) times daily with a meal. 07/24/13  Yes Francine Graven, DO   BP 124/88 mmHg  Pulse 89  Temp(Src) 98.6 F (37 C) (Oral)  Resp 18  Ht 5\' 3"  (1.6 m)  Wt 180 lb (81.647 kg)  BMI 31.89 kg/m2  SpO2 100% Physical Exam  Constitutional: She is oriented to person, place, and time. She appears well-developed and well-nourished.  HENT:  Head: Normocephalic and atraumatic.  Right Ear: External ear normal.  Left Ear: External ear normal.  Mouth/Throat: Oropharynx is clear and moist and mucous membranes are normal.  Eyes: Conjunctivae and EOM are normal. Pupils are equal, round, and reactive to light.  Neck: Normal range of motion. Neck supple.  Cardiovascular: Normal rate, regular rhythm, normal heart sounds and intact distal pulses.   Pulmonary/Chest: Effort normal and breath sounds normal.  Abdominal: Soft. Bowel sounds are normal. There is no tenderness.  Musculoskeletal: Normal range of motion.  Neurological: She is alert and oriented to person, place, and time.  Skin: Skin is warm and dry.  Vitals reviewed.   ED  Course  Procedures  DIAGNOSTIC STUDIES: Oxygen Saturation is 100% on RA, normal by my interpretation.    COORDINATION OF CARE: 4:02 PM - Discussed probable viral URI. Discussed plans to order steroid. Pt advised of plan for treatment and pt agrees.  Labs Review Labs Reviewed - No data to display  Imaging Review Dg Chest 2 View  02/23/2015  CLINICAL DATA:  Shortness of breath, fever,  congestion EXAM: CHEST  2 VIEW COMPARISON:  07/24/2013 FINDINGS: The heart size and mediastinal contours are within normal limits. Both lungs are clear. The visualized skeletal structures are unremarkable. IMPRESSION: No active cardiopulmonary disease. Electronically Signed   By: Lahoma Crocker M.D.   On: 02/23/2015 15:49   I have personally reviewed and evaluated these images and lab results as part of my medical decision-making.   EKG Interpretation None      MDM   Final diagnoses:  Cough    34 y.o. female without pertinent PMH presents with signs/symptoms of URI, +sick contacts with similar symptoms.  Physical exam benign.  Wu unremarkable.  DC home after decadron.    I have reviewed all laboratory and imaging studies if ordered as above  1. Cough           Debby Freiberg, MD 02/23/15 [id number]  Debby Freiberg, MD 02/23/15 [phone]

## 2017-01-31 IMAGING — CR DG CHEST 2V
2 series · 2 of 2 positions shown · non-contrast
Comparison: 07/24/2013

CLINICAL DATA: Shortness of breath, fever, congestion

EXAM:
CHEST  2 VIEW

[w chest pa]
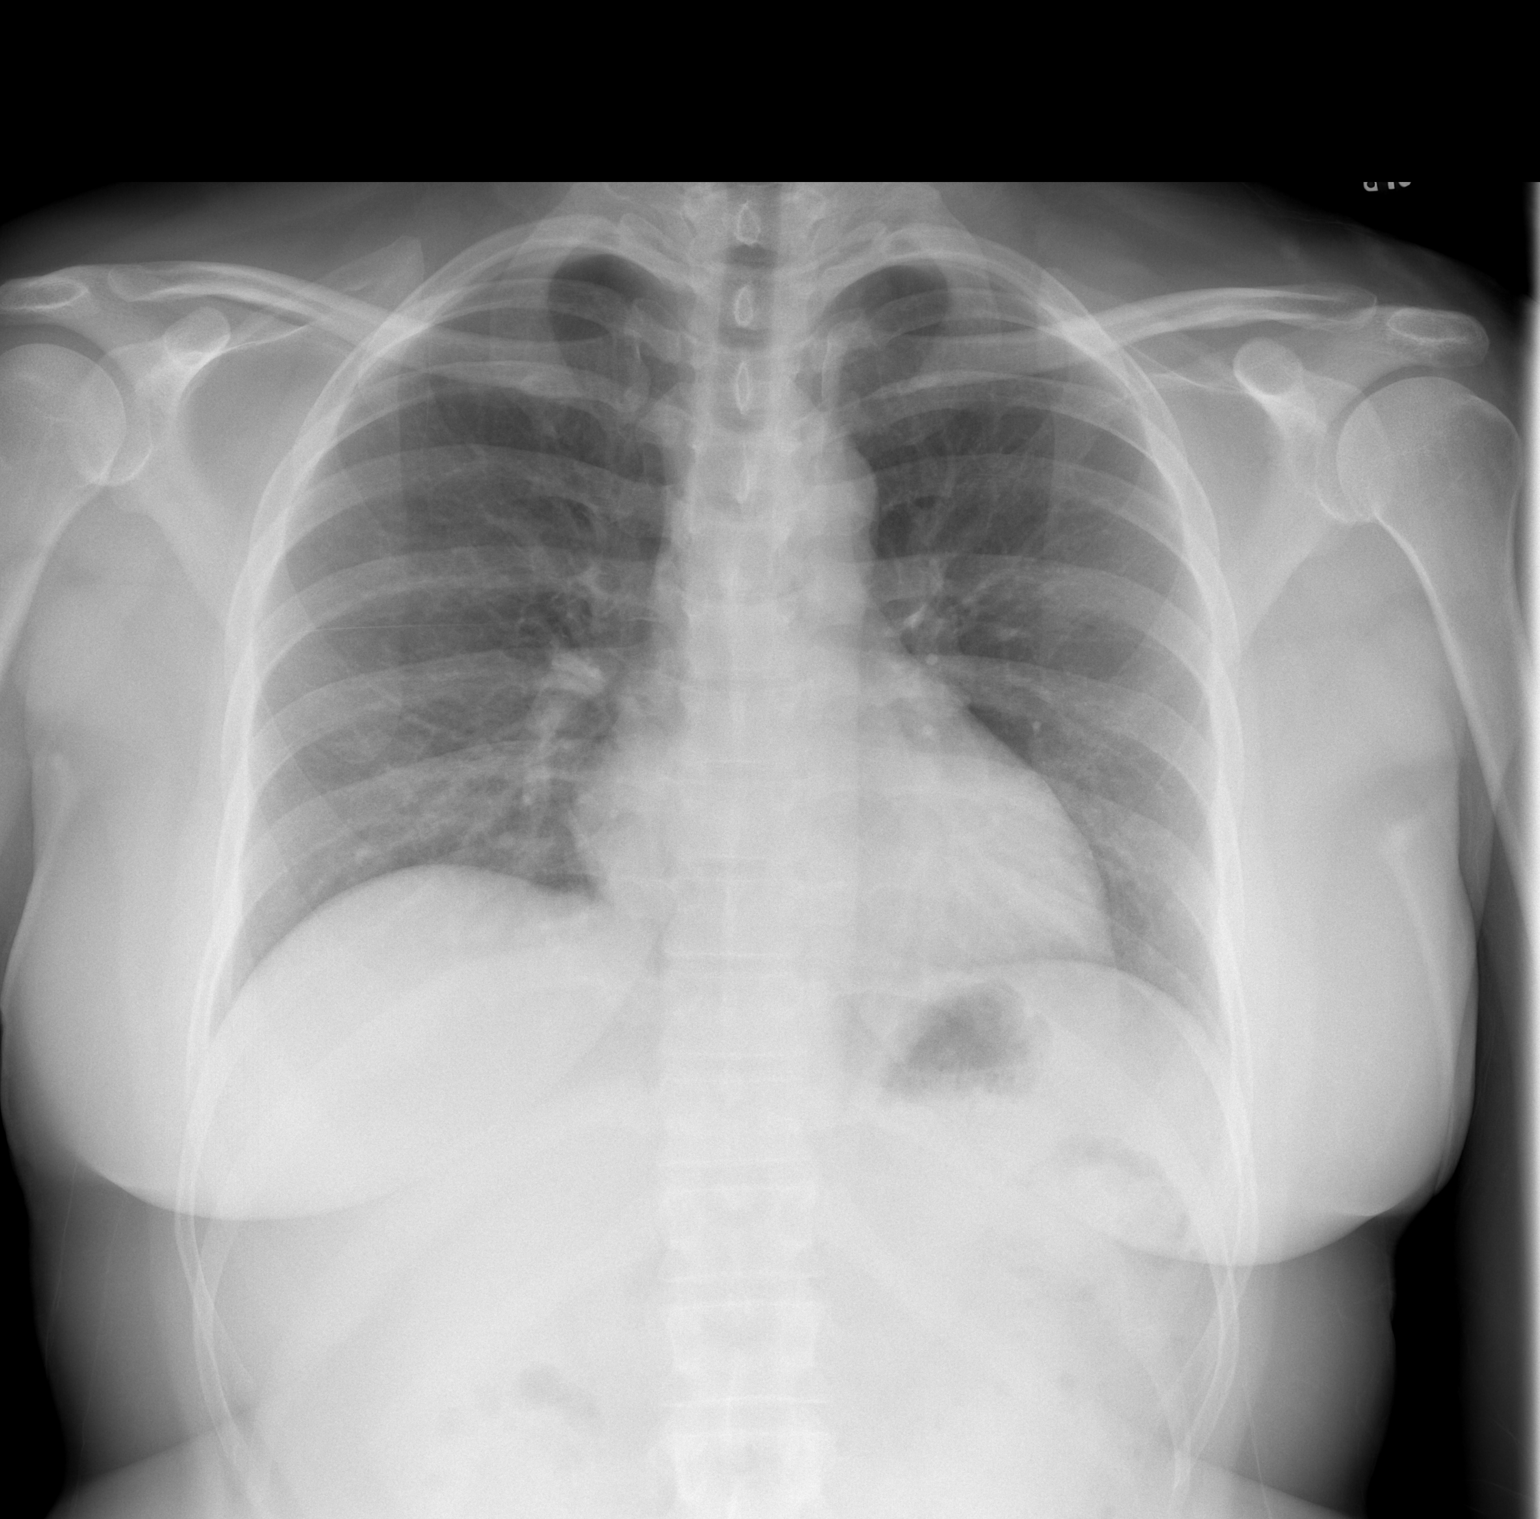

[w chest lat]
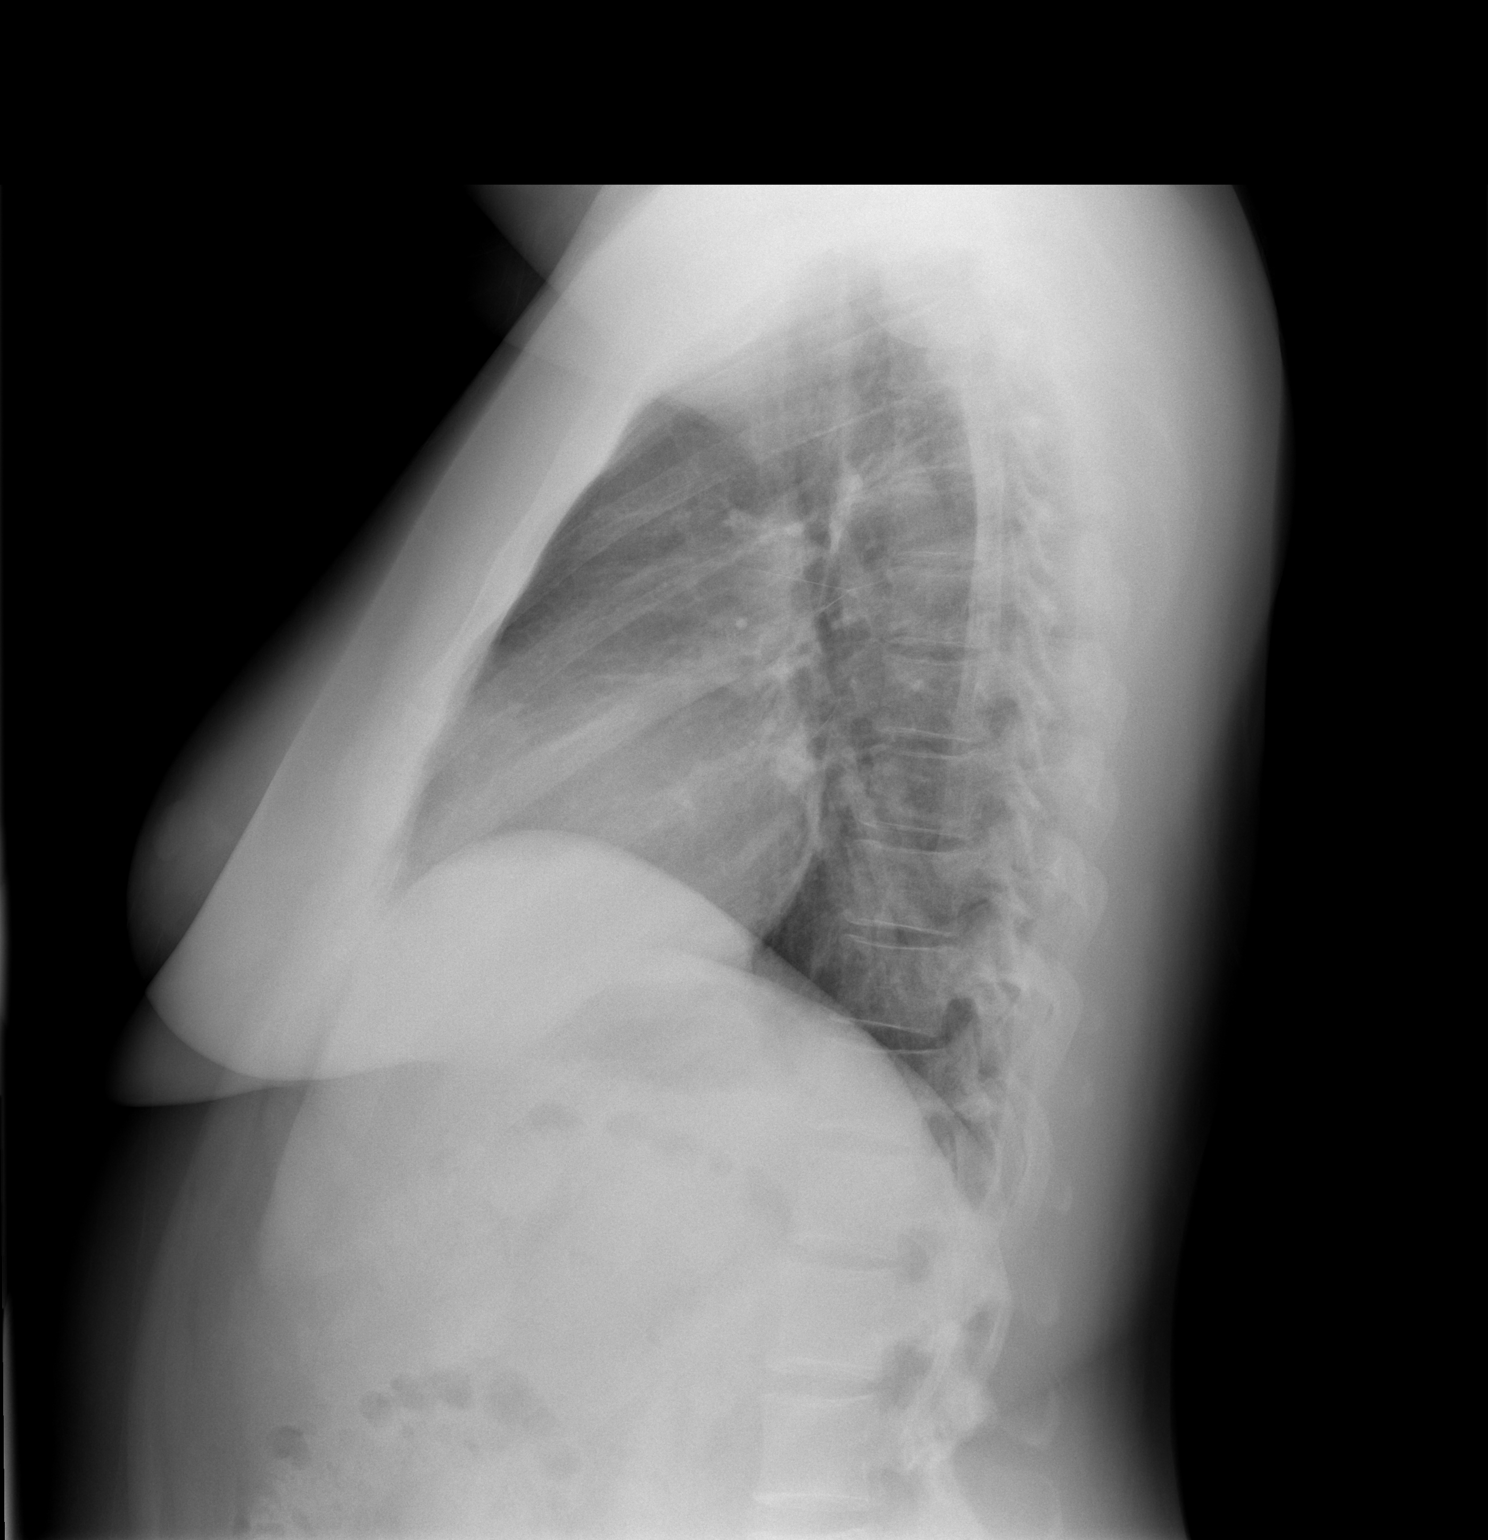

[2 of 2 positions shown; findings below may reference images not displayed]

FINDINGS: The heart size and mediastinal contours are within normal limits.
Both lungs are clear. The visualized skeletal structures are
unremarkable.
IMPRESSION: No active cardiopulmonary disease.

## 2020-11-28 ENCOUNTER — Emergency Department (HOSPITAL_COMMUNITY)
Admission: EM | Admit: 2020-11-28 | Discharge: 2020-11-28 | Disposition: A | Discharge status: Home / Self Care | Payer: Self-pay | Attending: Emergency Medicine | Admitting: Emergency Medicine

## 2020-11-28 ENCOUNTER — Encounter (HOSPITAL_COMMUNITY): Payer: Self-pay | Admitting: Emergency Medicine

## 2020-11-28 DIAGNOSIS — F17210 Nicotine dependence, cigarettes, uncomplicated: Secondary | ICD-10-CM | POA: Insufficient documentation

## 2020-11-28 DIAGNOSIS — K047 Periapical abscess without sinus: Secondary | ICD-10-CM | POA: Insufficient documentation

## 2020-11-28 MED ORDER — AMOXICILLIN-POT CLAVULANATE 875-125 MG PO TABS: 1.0000 | ORAL_TABLET | Freq: Two times a day (BID) | ORAL | 0 refills | Status: AC

## 2020-11-28 NOTE — ED Provider Notes (Signed)
[facility] EMERGENCY DEPARTMENT Provider Note   CSN: [id number] Arrival date & time: 11/28/20  [id number]     History No chief complaint on file.   Debra Walsh is a 40 y.o. female.  40 year old female with complaint of left lower dental pain and swelling.  Reports she has a tooth that is going bad and needs to see a dentist.  Swelling started a few days ago, progressively worsening.  Denies fever, trauma, drainage.  No other complaints or concerns.      No past medical history on file.  There are no problems to display for this patient.   No past surgical history on file.   OB History   No obstetric history on file.     No family history on file.  Social History   Tobacco Use   Smoking status: Every Day    Packs/day: 0.50    Types: Cigarettes  Substance Use Topics   Alcohol use: Yes    Comment: occ   Drug use: No    Home Medications Prior to Admission medications   Medication Sig Start Date End Date Taking? Authorizing Provider  amoxicillin-clavulanate (AUGMENTIN) 875-125 MG tablet Take 1 tablet by mouth every 12 (twelve) hours. 11/28/20  Yes Tacy Learn, PA-C  acetaminophen (TYLENOL) 500 MG tablet Take 1,000 mg by mouth every 6 (six) hours as needed for mild pain.    [provider]  naproxen (NAPROSYN) 250 MG tablet Take 1 tablet (250 mg total) by mouth 2 (two) times daily with a meal. 07/24/13   Francine Graven, DO    Allergies    Patient has no known allergies.  Review of Systems   Review of Systems  Constitutional:  Negative for chills and fever.  HENT:  Positive for dental problem. Negative for trouble swallowing and voice change.   Gastrointestinal:  Negative for vomiting.  Musculoskeletal:  Negative for neck pain and neck stiffness.  Skin:  Negative for rash and wound.  Allergic/Immunologic: Negative for immunocompromised state.  Neurological:  Negative for headaches.  Hematological:  Negative for adenopathy.   Psychiatric/Behavioral:  Negative for confusion.   All other systems reviewed and are negative.  Physical Exam Updated Vital Signs BP 132/87   Pulse 77   Temp 98.4 F (36.9 C) (Oral)   Resp 16   SpO2 97%   Physical Exam Vitals and nursing note reviewed.  Constitutional:      General: She is not in acute distress.    Appearance: She is well-developed. She is not diaphoretic.  HENT:     Head: Normocephalic and atraumatic.     Jaw: No trismus.     Nose: Nose normal.     Mouth/Throat:     Mouth: Mucous membranes are moist.     Dentition: Dental abscesses present.     Comments: Left mandible swelling correlates with decay of left lower tooth.  No drainable collection at this time.  No trismus.  Tongue extends to midline, no submandibular or sublingual swelling. No overlying erythema.  Eyes:     Conjunctiva/sclera: Conjunctivae normal.  Pulmonary:     Effort: Pulmonary effort is normal.  Musculoskeletal:     Cervical back: Neck supple.  Lymphadenopathy:     Cervical: No cervical adenopathy.  Skin:    General: Skin is warm and dry.     Findings: No erythema or rash.  Neurological:     Mental Status: She is alert and oriented to person, place, and time.  Psychiatric:  Behavior: Behavior normal.    ED Results / Procedures / Treatments   Labs (all labs ordered are listed, but only abnormal results are displayed) Labs Reviewed - No data to display  EKG None  Radiology No results found.  Procedures Procedures   Medications Ordered in ED Medications - No data to display  ED Course  I have reviewed the triage vital signs and the nursing notes.  Pertinent labs & imaging results that were available during my care of the patient were reviewed by me and considered in my medical decision making (see chart for details).  Clinical Course as of 11/28/20 0923  Thu Nov 29, 5947  8721 40 year old female with left lower dental abscess.  Plan is to treat with  antibiotics and refer to dentistry. [LM]    Clinical Course User Index [LM] Roque Lias   MDM Rules/Calculators/A&P                           Final Clinical Impression(s) / ED Diagnoses Final diagnoses:  Dental abscess    Rx / DC Orders ED Discharge Orders          Ordered    amoxicillin-clavulanate (AUGMENTIN) 875-125 MG tablet  Every 12 hours        11/28/20 0922             Tacy Learn, PA-C 11/28/20 [id number]    Wyvonnia Dusky, MD 11/28/20 1119

## 2020-11-28 NOTE — ED Triage Notes (Signed)
Pt here from home with c/o swelling to the left lower side of her mouth due to a bad tooth, nad noted ,

## 2020-11-28 NOTE — Discharge Instructions (Addendum)
Take antibiotics as prescribed. Follow-up with a dentist, call to schedule an appointment.  You have been given a referral as well as a list of area dental options.

## 2022-01-24 ENCOUNTER — Emergency Department (HOSPITAL_COMMUNITY): Payer: 59

## 2022-01-24 ENCOUNTER — Encounter (HOSPITAL_COMMUNITY): Payer: Self-pay | Admitting: Emergency Medicine

## 2022-01-24 ENCOUNTER — Emergency Department (HOSPITAL_COMMUNITY)
Admission: EM | Admit: 2022-01-24 | Discharge: 2022-01-25 | Disposition: A | Discharge status: Home / Self Care | Payer: 59 | Attending: Emergency Medicine | Admitting: Emergency Medicine

## 2022-01-24 ENCOUNTER — Other Ambulatory Visit: Payer: Self-pay

## 2022-01-24 DIAGNOSIS — E876 Hypokalemia: Secondary | ICD-10-CM | POA: Insufficient documentation

## 2022-01-24 DIAGNOSIS — D259 Leiomyoma of uterus, unspecified: Secondary | ICD-10-CM

## 2022-01-24 DIAGNOSIS — R1030 Lower abdominal pain, unspecified: Secondary | ICD-10-CM

## 2022-01-24 LAB — COMPREHENSIVE METABOLIC PANEL
ALT: 13 U/L (ref 0–44)
AST: 22 U/L (ref 15–41)
Albumin: 4.5 g/dL (ref 3.5–5.0)
Alkaline Phosphatase: 47 U/L (ref 38–126)
Anion gap: 9 (ref 5–15)
BUN: 11 mg/dL (ref 6–20)
CO2: 24 mmol/L (ref 22–32)
Calcium: 9.3 mg/dL (ref 8.9–10.3)
Chloride: 104 mmol/L (ref 98–111)
Creatinine, Ser: 0.69 mg/dL (ref 0.44–1.00)
GFR, Estimated: >60 mL/min (ref >60)
Glucose, Bld: 122 mg/dL — ABNORMAL HIGH (ref 70–99)
Potassium: 3 mmol/L — ABNORMAL LOW (ref 3.5–5.1)
Sodium: 137 mmol/L (ref 135–145)
Total Bilirubin: 1 mg/dL (ref 0.3–1.2)
Total Protein: 8.1 g/dL (ref 6.5–8.1)

## 2022-01-24 LAB — CBC
HCT: 39.2 % (ref 36.0–46.0)
Hemoglobin: 13.6 g/dL (ref 12.0–15.0)
MCH: 32.6 pg (ref 26.0–34.0)
MCHC: 34.7 g/dL (ref 30.0–36.0)
MCV: 94 fL (ref 80.0–100.0)
Platelets: 344 10*3/uL (ref 150–400)
RBC: 4.17 MIL/uL (ref 3.87–5.11)
RDW: 12.7 % (ref 11.5–15.5)
WBC: 7.6 10*3/uL (ref 4.0–10.5)
nRBC: 0 % (ref 0.0–0.2)

## 2022-01-24 LAB — I-STAT BETA HCG BLOOD, ED (MC, WL, AP ONLY): I-stat hCG, quantitative: <5 m[IU]/mL (ref <5)

## 2022-01-24 LAB — LIPASE, BLOOD: Lipase: 39 U/L (ref 11–51)

## 2022-01-24 MED ORDER — ONDANSETRON HCL 4 MG/2ML IJ SOLN
4.0000 mg | Freq: Once | INTRAMUSCULAR | Status: AC | PRN
  Administered 2022-01-24: 4 mg via INTRAVENOUS
  Filled 2022-01-24: qty 2

## 2022-01-24 NOTE — ED Provider Triage Note (Signed)
Emergency Medicine Provider Triage Evaluation Note  Debra Walsh , a 41 y.o. female  was evaluated in triage.  Pt complains of LLQ abdominal pain since yesterday.  Patient states the pain has been constant and has been worsening despite taking ASA.  She says that she is on her menstrual cycle and does not feel like it is any heavier or lighter than normal.  She is denies any change in bowel movements, fevers, chills, nausea, vomiting, abnormal vaginal discharge, dysuria, hematuria, or flank pain.  She does not have any history of diverticulitis or ovarian cyst.  She does states she has a history of ectopic pregnancy.  She is sexually active, but does not think she is pregnant as she is on her period.  Review of Systems  Positive:  Negative:   Physical Exam  Ht '5\' 3"'$  (1.6 m)   Wt 59 kg   BMI 23.03 kg/m  Gen:   Awake, no distress   Resp:  Normal effort  MSK:   Moves extremities without difficulty  Other:  + LLQ tenderness  Medical Decision Making  Medically screening exam initiated at 10:04 PM.  Appropriate orders placed.  Debra Walsh was informed that the remainder of the evaluation will be completed by another provider, this initial triage assessment does not replace that evaluation, and the importance of remaining in the ED until their evaluation is complete.     Adolphus Birchwood, [place] 01/24/22 2206

## 2022-01-24 NOTE — ED Triage Notes (Signed)
  Patient BIB EMS for LLQ abdominal pain that started yesterday.  Patient states the pain started out as cramping and now is aching.  Three episodes of emesis today and states it was clear.  Normal BM earlier today.  States pain is so bad it makes her feel short of breath.  Pain 10/10. Afebrile on arrival.

## 2022-01-25 ENCOUNTER — Encounter (HOSPITAL_COMMUNITY): Payer: Self-pay

## 2022-01-25 ENCOUNTER — Emergency Department (HOSPITAL_COMMUNITY): Payer: 59

## 2022-01-25 LAB — URINALYSIS, MICROSCOPIC (REFLEX)
Bacteria, UA: NONE SEEN
WBC, UA: NONE SEEN WBC/hpf (ref 0–5)

## 2022-01-25 LAB — URINALYSIS, ROUTINE W REFLEX MICROSCOPIC
Bilirubin Urine: NEGATIVE
Glucose, UA: NEGATIVE mg/dL
Ketones, ur: NEGATIVE mg/dL
Leukocytes,Ua: NEGATIVE
Nitrite: NEGATIVE
Protein, ur: 100 mg/dL — AB
Specific Gravity, Urine: 1.01 (ref 1.005–1.030)
pH: >9 — ABNORMAL HIGH (ref 5.0–8.0)

## 2022-01-25 MED ORDER — NAPROXEN 500 MG PO TABS: 500.0000 mg | ORAL_TABLET | Freq: Two times a day (BID) | ORAL | 0 refills | Status: DC | PRN

## 2022-01-25 MED ORDER — KETOROLAC TROMETHAMINE 15 MG/ML IJ SOLN
15.0000 mg | Freq: Once | INTRAMUSCULAR | Status: AC
  Administered 2022-01-25: 15 mg via INTRAVENOUS
  Filled 2022-01-25: qty 1

## 2022-01-25 MED ORDER — IOHEXOL 300 MG/ML  SOLN
100.0000 mL | Freq: Once | INTRAMUSCULAR | Status: AC | PRN
  Administered 2022-01-25: 100 mL via INTRAVENOUS

## 2022-01-25 NOTE — Discharge Instructions (Addendum)
Your evaluation in the emergency department was reassuring.  Your CT showed evidence of uterine fibroids which can cause heavier and/or irregular periods.  Fibroids can also cause increased cramping during menstrual cycles.  Take naproxen as prescribed for pain control.  Your CT also suggested possibility of underlying reflux/gastritis.  You may try over-the-counter Nexium or Prilosec for management of this.  Follow-up with a primary care doctor for further evaluation of symptoms.  Return for any new or concerning symptoms.

## 2022-01-26 NOTE — ED Provider Notes (Signed)
[facility] DEPT Provider Note   CSN: [id number] Arrival date & time: 01/24/22  2140     History  Chief Complaint  Patient presents with   Abdominal Pain   Emesis    Debra Walsh is a 41 y.o. female.  41 year old female presents to the emergency department for evaluation of left lower quadrant abdominal pain.  Symptoms began yesterday and have been constant.  She has had no relief with aspirin.  She is presently on her menstrual cycle without any acute change from her baseline.  No fevers, chills, nausea, vomiting, vaginal discharge, dysuria, hematuria, flank pain.  She has a history of prior surgery for ectopic pregnancy.  The history is provided by the patient. No language interpreter was used.  Abdominal Pain Associated symptoms: vomiting   Emesis Associated symptoms: abdominal pain        Home Medications Prior to Admission medications   Medication Sig Start Date End Date Taking? Authorizing Provider  ascorbic acid (VITAMIN C) 500 MG tablet Take 500 mg by mouth daily.   Yes [provider]  naproxen (NAPROSYN) 500 MG tablet Take 1 tablet (500 mg total) by mouth every 12 (twelve) hours as needed for moderate pain or mild pain. 01/25/22  Yes Antonietta Breach, PA-C  Prenatal Vit-Fe Fumarate-FA (MULTIVITAMIN-PRENATAL) 27-0.8 MG TABS tablet Take 1 tablet by mouth daily at 12 noon.   Yes [provider]  amoxicillin-clavulanate (AUGMENTIN) 875-125 MG tablet Take 1 tablet by mouth every 12 (twelve) hours. Patient not taking: Reported on 01/25/2022 11/28/20   Tacy Learn, PA-C      Allergies    Patient has no known allergies.    Review of Systems   Review of Systems  Gastrointestinal:  Positive for abdominal pain and vomiting.  Ten systems reviewed and are negative for acute change, except as noted in the HPI.    Physical Exam Updated Vital Signs BP 109/70 (BP Location: Right Arm)   Pulse 67   Temp 98.6 F (37 C) (Oral)    Resp 15   Ht '5\' 3"'$  (1.6 m)   Wt 59 kg   SpO2 100%   BMI 23.03 kg/m   Physical Exam Vitals and nursing note reviewed.  Constitutional:      General: She is not in acute distress.    Appearance: She is well-developed. She is not diaphoretic.     Comments: Nontoxic-appearing and in no acute distress  HENT:     Head: Normocephalic and atraumatic.  Eyes:     General: No scleral icterus.    Conjunctiva/sclera: Conjunctivae normal.  Pulmonary:     Effort: Pulmonary effort is normal. No respiratory distress.     Comments: Respirations even and unlabored Abdominal:     Comments: Mild tenderness in the left lower quadrant elicited only with deep palpation.  Abdomen is otherwise soft, nondistended.  No peritoneal signs.  Musculoskeletal:        General: Normal range of motion.     Cervical back: Normal range of motion.  Skin:    General: Skin is warm and dry.     Coloration: Skin is not pale.     Findings: No erythema or rash.  Neurological:     Mental Status: She is alert and oriented to person, place, and time.  Psychiatric:        Behavior: Behavior normal.     ED Results / Procedures / Treatments   Labs (all labs ordered are listed, but only abnormal results  are displayed) Labs Reviewed  COMPREHENSIVE METABOLIC PANEL - Abnormal; Notable for the following components:      Result Value   Potassium 3.0 (*)    Glucose, Bld 122 (*)    All other components within normal limits  URINALYSIS, ROUTINE W REFLEX MICROSCOPIC - Abnormal; Notable for the following components:   pH >9.0 (*)    Hgb urine dipstick LARGE (*)    Protein, ur 100 (*)    All other components within normal limits  LIPASE, BLOOD  CBC  URINALYSIS, MICROSCOPIC (REFLEX)  I-STAT BETA HCG BLOOD, ED ([facility], [facility], [facility] ONLY)    EKG EKG Interpretation  Date/Time:  Saturday January 24 2022 21:57:00 EDT Ventricular Rate:  62 PR Interval:  155 QRS Duration: 87 QT Interval:  482 QTC Calculation: 490 R  Axis:   85 Text Interpretation: Sinus rhythm Borderline prolonged QT interval Confirmed by Regan Lemming ([id number]) on 01/24/2022 11:20:08 PM  Radiology CT Abdomen Pelvis W Contrast  Result Date: 01/25/2022 CLINICAL DATA:  Left lower quadrant abdominal pain with nausea and vomiting. EXAM: CT ABDOMEN AND PELVIS WITH CONTRAST TECHNIQUE: Multidetector CT imaging of the abdomen and pelvis was performed using the standard protocol following bolus administration of intravenous contrast. RADIATION DOSE REDUCTION: This exam was performed according to the departmental dose-optimization program which includes automated exposure control, adjustment of the mA and/or kV according to patient size and/or use of iterative reconstruction technique. CONTRAST:  114m OMNIPAQUE IOHEXOL 300 MG/ML  SOLN COMPARISON:  None Available. FINDINGS: Lower chest: No acute abnormality. Hepatobiliary: Scattered subcentimeter hypodensities are present in the liver, statistically most likely representing cysts or hemangiomas. No biliary ductal dilatation. The gallbladder is without stones. Pancreas: Unremarkable. No pancreatic ductal dilatation or surrounding inflammatory changes. Spleen: Normal in size without focal abnormality. Adrenals/Urinary Tract: The adrenal glands are within normal limits. The kidneys enhance symmetrically. No renal calculus or hydronephrosis. The bladder is unremarkable. Stomach/Bowel: Gastric wall thickening is noted. No bowel obstruction, free air, or pneumatosis. Appendix appears normal. Vascular/Lymphatic: Aortic atherosclerosis. No enlarged abdominal or pelvic lymph nodes. Reproductive: Multiple hypodense lesions are present in the uterus, suggesting fibroids. No adnexal mass. Other: No abdominopelvic ascites. Musculoskeletal: No acute osseous abnormality. IMPRESSION: 1. No abnormality to explain reported left lower quadrant abdominal pain. 2. Gastric wall thickening, suggesting gastritis. 3. Uterine fibroids. 4.  Aortic atherosclerosis. Electronically Signed   By: LBrett FairyM.D.   On: 01/25/2022 00:33    Procedures Procedures    Medications Ordered in ED Medications  ondansetron (ZOFRAN) injection 4 mg (4 mg Intravenous Given 01/24/22 2209)  iohexol (OMNIPAQUE) 300 MG/ML solution 100 mL (100 mLs Intravenous Contrast Given 01/25/22 0010)  ketorolac (TORADOL) 15 MG/ML injection 15 mg (15 mg Intravenous Given 01/25/22 0529)    ED Course/ Medical Decision Making/ A&P                           Medical Decision Making Amount and/or Complexity of Data Reviewed Labs: ordered.  Risk Prescription drug management.   This patient presents to the ED for concern of LLQ pain, this involves an extensive number of treatment options, and is a complaint that carries with it a high risk of complications and morbidity.  The differential diagnosis includes diverticulitis vs dysmenorrhea vs ectopic pregnancy vs TOA vs ovarian torsion vs hemorrhagic cyst vs kidney stone vs constipation   Co morbidities that complicate the patient evaluation  None    Additional history obtained:  External  records from outside source obtained and reviewed including pelvic US in 2007 for ectopic pregnancy   Lab Tests:  I Ordered, and personally interpreted labs.  The pertinent results include: Hypokalemia of 3.0.  Otherwise, normal Geraghty blood cell count.  No electrolyte derangements.  Liver and kidney function preserved.  Urinalysis negative for UTI and pregnancy is negative.   Imaging Studies ordered:  I ordered imaging studies including CT abdomen pelvis I independently visualized and interpreted imaging which showed presence of uterine fibroids.  Slight gastric wall thickening suggestive of gastritis I agree with the radiologist interpretation   Cardiac Monitoring:  The patient was maintained on a cardiac monitor.  I personally viewed and interpreted the cardiac monitored which showed an underlying rhythm of:  Normal sinus rhythm   Medicines ordered and prescription drug management:  I ordered medication including Toradol for abdominal pain Reevaluation of the patient after these medicines showed that the patient improved I have reviewed the patients home medicines and have made adjustments as needed   Test Considered:  Pelvic ultrasound   Reevaluation:  After the interventions noted above, I reevaluated the patient and found that they have :improved   Social Determinants of Health:  Insured patient   Dispostion:  After consideration of the diagnostic results and the patients response to treatment, I feel that the patent would benefit from outpatient course of naproxen for management of pain.  Have encouraged follow-up with an OB/GYN and/or primary care doctor. Return precautions discussed and provided. Patient discharged in stable condition with no unaddressed concerns.          Final Clinical Impression(s) / ED Diagnoses Final diagnoses:  Lower abdominal pain  Uterine leiomyoma, unspecified location    Rx / DC Orders ED Discharge Orders          Ordered    naproxen (NAPROSYN) 500 MG tablet  Every 12 hours PRN        01/25/22 0536              Antonietta Breach, PA-C 01/26/22 [id number]    Veryl Speak, MD 01/26/22 [phone]

## 2022-07-01 ENCOUNTER — Encounter: Payer: Self-pay | Admitting: Obstetrics

## 2022-07-01 ENCOUNTER — Other Ambulatory Visit (HOSPITAL_COMMUNITY)
Admission: RE | Admit: 2022-07-01 | Discharge: 2022-07-01 | Disposition: A | Discharge status: Home / Self Care | Payer: 59 | Source: Ambulatory Visit | Attending: Obstetrics | Admitting: Obstetrics

## 2022-07-01 ENCOUNTER — Ambulatory Visit (INDEPENDENT_AMBULATORY_CARE_PROVIDER_SITE_OTHER): Payer: 59 | Admitting: Obstetrics

## 2022-07-01 VITALS — BP 120/75 | HR 97 | Ht 63.0 in | Wt 127.0 lb

## 2022-07-01 DIAGNOSIS — Z01419 Encounter for gynecological examination (general) (routine) without abnormal findings: Secondary | ICD-10-CM

## 2022-07-01 DIAGNOSIS — N898 Other specified noninflammatory disorders of vagina: Secondary | ICD-10-CM | POA: Insufficient documentation

## 2022-07-01 DIAGNOSIS — N946 Dysmenorrhea, unspecified: Secondary | ICD-10-CM | POA: Diagnosis not present

## 2022-07-01 DIAGNOSIS — Z3009 Encounter for other general counseling and advice on contraception: Secondary | ICD-10-CM | POA: Diagnosis not present

## 2022-07-01 DIAGNOSIS — Z1339 Encounter for screening examination for other mental health and behavioral disorders: Secondary | ICD-10-CM | POA: Diagnosis not present

## 2022-07-01 DIAGNOSIS — Z1239 Encounter for other screening for malignant neoplasm of breast: Secondary | ICD-10-CM

## 2022-07-01 DIAGNOSIS — Z113 Encounter for screening for infections with a predominantly sexual mode of transmission: Secondary | ICD-10-CM

## 2022-07-01 DIAGNOSIS — J301 Allergic rhinitis due to pollen: Secondary | ICD-10-CM

## 2022-07-01 MED ORDER — IBUPROFEN 800 MG PO TABS: 800.0000 mg | ORAL_TABLET | Freq: Three times a day (TID) | ORAL | 5 refills | Status: AC | PRN

## 2022-07-01 MED ORDER — LORATADINE 10 MG PO TABS: 10.0000 mg | ORAL_TABLET | Freq: Every day | ORAL | 11 refills | Status: AC

## 2022-07-01 NOTE — Progress Notes (Signed)
Subjective:        Debra Walsh is a 42 y.o. female here for a routine exam.  Current complaints: Malodorous vaginal discharge.    Personal health questionnaire:  Is patient Ashkenazi Jewish, have a family history of breast and/or ovarian cancer: no Is there a family history of uterine cancer diagnosed at age < 61, gastrointestinal cancer, urinary tract cancer, family member who is a Field seismologist syndrome-associated carrier: no Is the patient overweight and hypertensive, family history of diabetes, personal history of gestational diabetes, preeclampsia or PCOS: no Is patient over 34, have PCOS,  family history of premature CHD under age 64, diabetes, smoke, have hypertension or peripheral artery disease:  no At any time, has a partner hit, kicked or otherwise hurt or frightened you?: no Over the past 2 weeks, have you felt down, depressed or hopeless?: no Over the past 2 weeks, have you felt little interest or pleasure in doing things?:no   Gynecologic History Patient's last menstrual period was 06/02/2022 (approximate). Contraception: none Last Pap: unknown. Results were: normal Last mammogram: none. Results were: none  Obstetric History OB History  Gravida Para Term Preterm AB Living  3 1 1   1 1   SAB IAB Ectopic Multiple Live Births      1   1    # Outcome Date GA Lbr Len/2nd Weight Sex Delivery Anes PTL Lv  3 Ectopic 2009          2 Term 06/10/97    F Vag-Spont   LIV  1 [place]             Past Medical History:  Diagnosis Date   Anemia    Depression     Past Surgical History:  Procedure Laterality Date   DILATION AND CURETTAGE OF UTERUS       Current Outpatient Medications:    ibuprofen (ADVIL) 800 MG tablet, Take 1 tablet (800 mg total) by mouth every 8 (eight) hours as needed., Disp: 30 tablet, Rfl: 5   loratadine (CLARITIN) 10 MG tablet, Take 1 tablet (10 mg total) by mouth daily., Disp: 30 tablet, Rfl: 11   amoxicillin-clavulanate (AUGMENTIN) 875-125 MG tablet,  Take 1 tablet by mouth every 12 (twelve) hours. (Patient not taking: Reported on 01/25/2022), Disp: 14 tablet, Rfl: 0   ascorbic acid (VITAMIN C) 500 MG tablet, Take 500 mg by mouth daily. (Patient not taking: Reported on 07/01/2022), Disp: , Rfl:    Prenatal Vit-Fe Fumarate-FA (MULTIVITAMIN-PRENATAL) 27-0.8 MG TABS tablet, Take 1 tablet by mouth daily at 12 noon. (Patient not taking: Reported on 07/01/2022), Disp: , Rfl:  No Known Allergies  Social History   Tobacco Use   Smoking status: Former    Packs/day: .5    Types: Cigarettes   Smokeless tobacco: Never  Substance Use Topics   Alcohol use: Yes    Comment: occ    History reviewed. No pertinent family history.    Review of Systems  Constitutional: negative for fatigue and weight loss Respiratory: negative for cough and wheezing Cardiovascular: negative for chest pain, fatigue and palpitations Gastrointestinal: negative for abdominal pain and change in bowel habits Musculoskeletal:negative for myalgias Neurological: negative for gait problems and tremors Behavioral/Psych: negative for abusive relationship, depression Endocrine: negative for temperature intolerance    Genitourinary: positive for vaginal discharge.  negative for abnormal menstrual periods, genital lesions, hot flashes, sexual problems  Integument/breast: negative for breast lump, breast tenderness, nipple discharge and skin lesion(s)    Objective:  BP 120/75   Pulse 97   Ht 5\' 3"  (1.6 m)   Wt 127 lb (57.6 kg)   LMP 06/02/2022 (Approximate)   BMI 22.50 kg/m  General:   Alert and no distress  Skin:   no rash or abnormalities  Lungs:   clear to auscultation bilaterally  Heart:   regular rate and rhythm, S1, S2 normal, no murmur, click, rub or gallop  Breasts:   normal without suspicious masses, skin or nipple changes or axillary nodes  Abdomen:  normal findings: no organomegaly, soft, non-tender and no hernia  Pelvis:  External genitalia: normal general  appearance Urinary system: urethral meatus normal and bladder without fullness, nontender Vaginal: normal without tenderness, induration or masses Cervix: normal appearance Adnexa: normal bimanual exam Uterus: anteverted and non-tender, normal size   Lab Review Urine pregnancy test Labs reviewed yes Radiologic studies reviewed yes  I have spent a total of 20 minutes of face-to-face time, excluding clinical staff time, reviewing notes and preparing to see patient, ordering tests and/or medications, and counseling the patient.   Assessment:    1. Encounter for gynecological examination with Papanicolaou smear of cervix Rx: - Cytology - PAP( [place])  2. Dysmenorrhea Rx: - ibuprofen (ADVIL) 800 MG tablet; Take 1 tablet (800 mg total) by mouth every 8 (eight) hours as needed.  Dispense: 30 tablet; Refill: 5  3. Vaginal discharge Rx: - Cervicovaginal ancillary only( [place])  4. Screening for STD (sexually transmitted disease) Rx: - HIV antibody (with reflex) - Hepatitis C Antibody - RPR - Hepatitis B Surface AntiGEN  5. Screening breast examination Rx: - MM Digital Screening; Future  6. Encounter for other general counseling and advice on contraception - declines hormonal contraception - using condoms  7. Seasonal allergic rhinitis due to pollen Rx: - loratadine (CLARITIN) 10 MG tablet; Take 1 tablet (10 mg total) by mouth daily.  Dispense: 30 tablet; Refill: 11     Plan:    Education reviewed: calcium supplements, depression evaluation, low fat, low cholesterol diet, safe sex/STD prevention, self breast exams, skin cancer screening, and weight bearing exercise. Contraception: condoms. Mammogram ordered. Follow up in: 1 year.    Orders Placed This Encounter  Procedures   MM Digital Screening    Standing Status:   Future    Standing Expiration Date:   06/30/2023    Order Specific Question:   Reason for Exam (SYMPTOM  OR DIAGNOSIS REQUIRED)    Answer:    Screening    Order Specific Question:   Is the patient pregnant?    Answer:   No    Order Specific Question:   Preferred imaging location?    Answer:   [facility]   HIV antibody (with reflex)   Hepatitis C Antibody   RPR   Hepatitis B Surface AntiGEN     Shelly Bombard, MD 07/01/2022 11:34 AM

## 2022-07-01 NOTE — Progress Notes (Signed)
42 y.o New GYN, presents for AEX/PAP/STD screening.  C/o creamy vaginal discharge and odor.

## 2022-07-02 ENCOUNTER — Other Ambulatory Visit: Payer: Self-pay | Admitting: Obstetrics

## 2022-07-02 DIAGNOSIS — B379 Candidiasis, unspecified: Secondary | ICD-10-CM

## 2022-07-02 DIAGNOSIS — N760 Acute vaginitis: Secondary | ICD-10-CM

## 2022-07-02 DIAGNOSIS — A599 Trichomoniasis, unspecified: Secondary | ICD-10-CM

## 2022-07-02 DIAGNOSIS — A549 Gonococcal infection, unspecified: Secondary | ICD-10-CM

## 2022-07-02 DIAGNOSIS — A749 Chlamydial infection, unspecified: Secondary | ICD-10-CM

## 2022-07-02 LAB — RPR: RPR Ser Ql: NONREACTIVE

## 2022-07-02 LAB — CERVICOVAGINAL ANCILLARY ONLY
Bacterial Vaginitis (gardnerella): POSITIVE — AB
Candida Glabrata: NEGATIVE
Candida Vaginitis: POSITIVE — AB
Chlamydia: POSITIVE — AB
Comment: NEGATIVE
Comment: NEGATIVE
Comment: NEGATIVE
Comment: NEGATIVE
Comment: NEGATIVE
Comment: NORMAL
Neisseria Gonorrhea: POSITIVE — AB
Trichomonas: POSITIVE — AB

## 2022-07-02 LAB — HIV ANTIBODY (ROUTINE TESTING W REFLEX): HIV Screen 4th Generation wRfx: NONREACTIVE

## 2022-07-02 LAB — HEPATITIS B SURFACE ANTIGEN: Hepatitis B Surface Ag: NEGATIVE

## 2022-07-02 LAB — HEPATITIS C ANTIBODY: Hep C Virus Ab: NONREACTIVE

## 2022-07-02 MED ORDER — METRONIDAZOLE 500 MG PO TABS: 500.0000 mg | ORAL_TABLET | Freq: Two times a day (BID) | ORAL | 2 refills | Status: AC

## 2022-07-02 MED ORDER — CEFTRIAXONE SODIUM 500 MG IJ SOLR
500.0000 mg | Freq: Once | INTRAMUSCULAR | Status: AC
  Administered 2022-07-03: 500 mg via INTRAMUSCULAR

## 2022-07-02 MED ORDER — DOXYCYCLINE HYCLATE 100 MG PO CAPS: 100.0000 mg | ORAL_CAPSULE | Freq: Two times a day (BID) | ORAL | 0 refills | Status: AC

## 2022-07-02 MED ORDER — FLUCONAZOLE 150 MG PO TABS: 150.0000 mg | ORAL_TABLET | Freq: Once | ORAL | 0 refills | Status: AC

## 2022-07-03 ENCOUNTER — Ambulatory Visit (INDEPENDENT_AMBULATORY_CARE_PROVIDER_SITE_OTHER): Payer: 59

## 2022-07-03 ENCOUNTER — Ambulatory Visit: Payer: 59

## 2022-07-03 DIAGNOSIS — A549 Gonococcal infection, unspecified: Secondary | ICD-10-CM

## 2022-07-03 LAB — CYTOLOGY - PAP
Comment: NEGATIVE
Diagnosis: NEGATIVE
Diagnosis: REACTIVE
High risk HPV: NEGATIVE

## 2022-07-03 NOTE — Progress Notes (Signed)
Pt is in the office for Rocephin injection after +GC on 4/3 vaginal swab. Administered injection in LUOQ and pt tolerated well  .Marland Kitchen Administrations This Visit     cefTRIAXone (ROCEPHIN) injection 500 mg     Admin Date 07/03/2022 Action Given Dose 500 mg Route Intramuscular Administered By Katrina Stack, RN

## 2022-07-27 ENCOUNTER — Inpatient Hospital Stay: Admission: RE | Admit: 2022-07-27 | Payer: 59 | Source: Ambulatory Visit
